# Patient Record
Sex: Male | Born: 1980
Health system: Southern US, Community
[De-identification: ages and names within clinical notes are randomized; demographics above are authoritative.]

## PROBLEM LIST (undated history)

## (undated) DIAGNOSIS — N189 Chronic kidney disease, unspecified: Secondary | ICD-10-CM

## (undated) DIAGNOSIS — G629 Polyneuropathy, unspecified: Secondary | ICD-10-CM

## (undated) DIAGNOSIS — M7989 Other specified soft tissue disorders: Secondary | ICD-10-CM

## (undated) DIAGNOSIS — E785 Hyperlipidemia, unspecified: Secondary | ICD-10-CM

## (undated) DIAGNOSIS — H3500 Unspecified background retinopathy: Secondary | ICD-10-CM

## (undated) DIAGNOSIS — D649 Anemia, unspecified: Secondary | ICD-10-CM

## (undated) HISTORY — DX: Polyneuropathy, unspecified: G62.9

## (undated) HISTORY — PX: EYE SURGERY: SHX253

## (undated) HISTORY — PX: ARTERIOVENOUS GRAFT PLACEMENT: SUR1029

## (undated) HISTORY — DX: Other specified soft tissue disorders: M79.89

## (undated) HISTORY — DX: Unspecified background retinopathy: H35.00

## (undated) HISTORY — DX: Hyperlipidemia, unspecified: E78.5

## (undated) HISTORY — DX: Chronic kidney disease, unspecified: N18.9

## (undated) HISTORY — DX: Anemia, unspecified: D64.9

---

## 2005-03-17 ENCOUNTER — Emergency Department (HOSPITAL_COMMUNITY): Admission: EM | Admit: 2005-03-17 | Discharge: 2005-03-17 | Payer: Self-pay | Admitting: Emergency Medicine

## 2006-08-26 ENCOUNTER — Ambulatory Visit (HOSPITAL_COMMUNITY): Admission: RE | Admit: 2006-08-26 | Discharge: 2006-08-26 | Payer: Self-pay | Admitting: Unknown Physician Specialty

## 2007-01-14 ENCOUNTER — Ambulatory Visit: Payer: Self-pay | Admitting: Endocrinology

## 2007-01-14 ENCOUNTER — Encounter: Payer: Self-pay | Admitting: Endocrinology

## 2007-01-14 DIAGNOSIS — E109 Type 1 diabetes mellitus without complications: Secondary | ICD-10-CM | POA: Insufficient documentation

## 2007-01-14 DIAGNOSIS — I10 Essential (primary) hypertension: Secondary | ICD-10-CM | POA: Insufficient documentation

## 2007-01-14 LAB — CONVERTED CEMR LAB
BUN: 38 mg/dL — ABNORMAL HIGH (ref 6–23)
CO2: 33 meq/L — ABNORMAL HIGH (ref 19–32)
Calcium: 9.7 mg/dL (ref 8.4–10.5)
Chloride: 110 meq/L (ref 96–112)
Cholesterol: 209 mg/dL (ref 0–200)
Creatinine, Ser: 1.6 mg/dL — ABNORMAL HIGH (ref 0.4–1.5)
Direct LDL: 143.5 mg/dL
GFR calc Af Amer: 68 mL/min
GFR calc non Af Amer: 56 mL/min
Glucose, Bld: 134 mg/dL — ABNORMAL HIGH (ref 70–99)
HDL: 41.1 mg/dL (ref >39.0)
Hgb A1c MFr Bld: 10.4 % — ABNORMAL HIGH (ref 4.6–6.0)
Potassium: 5.6 meq/L — ABNORMAL HIGH (ref 3.5–5.1)
Sodium: 147 meq/L — ABNORMAL HIGH (ref 135–145)
TSH: 2.1 microintl units/mL (ref 0.35–5.50)
Total CHOL/HDL Ratio: 5.1
Triglycerides: 114 mg/dL (ref 0–149)
VLDL: 23 mg/dL (ref 0–40)

## 2007-01-19 ENCOUNTER — Ambulatory Visit: Payer: Self-pay

## 2007-01-19 ENCOUNTER — Encounter: Payer: Self-pay | Admitting: Endocrinology

## 2007-02-03 ENCOUNTER — Encounter: Payer: Self-pay | Admitting: *Deleted

## 2007-02-04 ENCOUNTER — Ambulatory Visit: Payer: Self-pay | Admitting: Endocrinology

## 2007-04-22 ENCOUNTER — Ambulatory Visit: Payer: Self-pay | Admitting: Endocrinology

## 2007-04-22 DIAGNOSIS — E875 Hyperkalemia: Secondary | ICD-10-CM

## 2007-04-22 DIAGNOSIS — E7800 Pure hypercholesterolemia, unspecified: Secondary | ICD-10-CM

## 2007-04-24 LAB — CONVERTED CEMR LAB
ALT: 29 units/L (ref 0–53)
AST: 23 units/L (ref 0–37)
Albumin: 3.2 g/dL — ABNORMAL LOW (ref 3.5–5.2)
Alkaline Phosphatase: 68 units/L (ref 39–117)
BUN: 39 mg/dL — ABNORMAL HIGH (ref 6–23)
Bilirubin, Direct: 0.2 mg/dL (ref 0.0–0.3)
CO2: 33 meq/L — ABNORMAL HIGH (ref 19–32)
Calcium: 8.9 mg/dL (ref 8.4–10.5)
Chloride: 100 meq/L (ref 96–112)
Cholesterol: 174 mg/dL (ref 0–200)
Creatinine, Ser: 1.7 mg/dL — ABNORMAL HIGH (ref 0.4–1.5)
GFR calc Af Amer: 62 mL/min
GFR calc non Af Amer: 52 mL/min
Glucose, Bld: 320 mg/dL — ABNORMAL HIGH (ref 70–99)
HDL: 34.7 mg/dL — ABNORMAL LOW (ref >39.0)
LDL Cholesterol: 124 mg/dL — ABNORMAL HIGH (ref 0–99)
Sodium: 137 meq/L (ref 135–145)
Total Bilirubin: 1 mg/dL (ref 0.3–1.2)
Triglycerides: 76 mg/dL (ref 0–149)
VLDL: 15 mg/dL (ref 0–40)

## 2007-05-17 ENCOUNTER — Encounter: Payer: Self-pay | Admitting: Endocrinology

## 2007-07-19 ENCOUNTER — Encounter: Payer: Self-pay | Admitting: Endocrinology

## 2007-09-22 ENCOUNTER — Ambulatory Visit: Payer: Self-pay | Admitting: Endocrinology

## 2007-09-22 LAB — CONVERTED CEMR LAB: Hgb A1c MFr Bld: 10.1 % — ABNORMAL HIGH (ref 4.6–6.0)

## 2008-02-13 ENCOUNTER — Telehealth: Payer: Self-pay | Admitting: Endocrinology

## 2008-02-27 ENCOUNTER — Telehealth: Payer: Self-pay | Admitting: Endocrinology

## 2008-03-07 ENCOUNTER — Encounter: Payer: Self-pay | Admitting: Endocrinology

## 2008-03-08 ENCOUNTER — Ambulatory Visit: Payer: Self-pay | Admitting: Endocrinology

## 2008-03-08 ENCOUNTER — Emergency Department (HOSPITAL_COMMUNITY): Admission: EM | Admit: 2008-03-08 | Discharge: 2008-03-08 | Payer: Self-pay | Admitting: *Deleted

## 2008-03-08 ENCOUNTER — Telehealth: Payer: Self-pay | Admitting: Endocrinology

## 2008-03-09 ENCOUNTER — Telehealth: Payer: Self-pay | Admitting: Endocrinology

## 2008-03-09 ENCOUNTER — Telehealth (INDEPENDENT_AMBULATORY_CARE_PROVIDER_SITE_OTHER): Payer: Self-pay | Admitting: *Deleted

## 2008-03-10 ENCOUNTER — Telehealth (INDEPENDENT_AMBULATORY_CARE_PROVIDER_SITE_OTHER): Payer: Self-pay | Admitting: *Deleted

## 2008-03-14 ENCOUNTER — Telehealth (INDEPENDENT_AMBULATORY_CARE_PROVIDER_SITE_OTHER): Payer: Self-pay | Admitting: *Deleted

## 2008-03-14 ENCOUNTER — Ambulatory Visit: Payer: Self-pay | Admitting: Endocrinology

## 2008-03-15 ENCOUNTER — Ambulatory Visit (HOSPITAL_COMMUNITY): Admission: RE | Admit: 2008-03-15 | Discharge: 2008-03-16 | Payer: Self-pay | Admitting: Ophthalmology

## 2008-03-19 ENCOUNTER — Telehealth: Payer: Self-pay | Admitting: Endocrinology

## 2008-04-11 ENCOUNTER — Telehealth: Payer: Self-pay | Admitting: Endocrinology

## 2008-06-07 ENCOUNTER — Ambulatory Visit: Payer: Self-pay | Admitting: Endocrinology

## 2008-08-23 ENCOUNTER — Other Ambulatory Visit: Payer: Self-pay | Admitting: Ophthalmology

## 2008-08-23 ENCOUNTER — Inpatient Hospital Stay (HOSPITAL_COMMUNITY): Admission: EM | Admit: 2008-08-23 | Discharge: 2008-08-26 | Payer: Self-pay | Admitting: Emergency Medicine

## 2008-08-23 ENCOUNTER — Ambulatory Visit: Payer: Self-pay | Admitting: Cardiology

## 2008-08-24 ENCOUNTER — Encounter (INDEPENDENT_AMBULATORY_CARE_PROVIDER_SITE_OTHER): Payer: Self-pay | Admitting: Internal Medicine

## 2009-06-25 ENCOUNTER — Telehealth (INDEPENDENT_AMBULATORY_CARE_PROVIDER_SITE_OTHER): Payer: Self-pay | Admitting: *Deleted

## 2009-06-25 ENCOUNTER — Ambulatory Visit (HOSPITAL_COMMUNITY): Admission: RE | Admit: 2009-06-25 | Discharge: 2009-06-26 | Payer: Self-pay | Admitting: Ophthalmology

## 2009-10-07 ENCOUNTER — Ambulatory Visit (HOSPITAL_COMMUNITY): Admission: RE | Admit: 2009-10-07 | Discharge: 2009-10-07 | Payer: Self-pay | Admitting: Chiropractic Medicine

## 2010-06-03 NOTE — Progress Notes (Signed)
   Phone Note From Other Clinic   Caller: [facility] Details for Reason: pt.information Initial call taken by: Joice Lofts Echo over to [facility] @ fax [phone] [facility]  June 25, 2009 10:39 AM

## 2010-07-23 LAB — CBC
HCT: 40.6 % (ref 39.0–52.0)
Hemoglobin: 13.6 g/dL (ref 13.0–17.0)
MCHC: 33.6 g/dL (ref 30.0–36.0)
MCV: 80.6 fL (ref 78.0–100.0)
Platelets: 312 10*3/uL (ref 150–400)
RBC: 5.03 MIL/uL (ref 4.22–5.81)
RDW: 13.8 % (ref 11.5–15.5)
WBC: 8.9 10*3/uL (ref 4.0–10.5)

## 2010-07-23 LAB — GLUCOSE, RANDOM: Glucose, Bld: 517 mg/dL — ABNORMAL HIGH (ref 70–99)

## 2010-07-23 LAB — GLUCOSE, CAPILLARY
Glucose-Capillary: 125 mg/dL — ABNORMAL HIGH (ref 70–99)
Glucose-Capillary: 156 mg/dL — ABNORMAL HIGH (ref 70–99)
Glucose-Capillary: 182 mg/dL — ABNORMAL HIGH (ref 70–99)
Glucose-Capillary: 228 mg/dL — ABNORMAL HIGH (ref 70–99)
Glucose-Capillary: 287 mg/dL — ABNORMAL HIGH (ref 70–99)
Glucose-Capillary: 392 mg/dL — ABNORMAL HIGH (ref 70–99)
Glucose-Capillary: 414 mg/dL — ABNORMAL HIGH (ref 70–99)
Glucose-Capillary: 432 mg/dL — ABNORMAL HIGH (ref 70–99)

## 2010-08-13 LAB — BASIC METABOLIC PANEL
BUN: 17 mg/dL (ref 6–23)
BUN: 30 mg/dL — ABNORMAL HIGH (ref 6–23)
CO2: 23 mEq/L (ref 19–32)
CO2: 29 mEq/L (ref 19–32)
CO2: 29 mEq/L (ref 19–32)
Calcium: 8.4 mg/dL (ref 8.4–10.5)
Calcium: 8.4 mg/dL (ref 8.4–10.5)
Calcium: 8.5 mg/dL (ref 8.4–10.5)
Calcium: 9.3 mg/dL (ref 8.4–10.5)
Chloride: 100 mEq/L (ref 96–112)
Chloride: 101 mEq/L (ref 96–112)
Creatinine, Ser: 1.6 mg/dL — ABNORMAL HIGH (ref 0.4–1.5)
Creatinine, Ser: 1.75 mg/dL — ABNORMAL HIGH (ref 0.4–1.5)
Creatinine, Ser: 1.76 mg/dL — ABNORMAL HIGH (ref 0.4–1.5)
GFR calc Af Amer: 56 mL/min — ABNORMAL LOW (ref >60)
GFR calc Af Amer: 56 mL/min — ABNORMAL LOW (ref >60)
GFR calc Af Amer: >60 mL/min (ref >60)
GFR calc non Af Amer: 47 mL/min — ABNORMAL LOW (ref >60)
GFR calc non Af Amer: 52 mL/min — ABNORMAL LOW (ref >60)
GFR calc non Af Amer: >60 mL/min (ref >60)
Glucose, Bld: 214 mg/dL — ABNORMAL HIGH (ref 70–99)
Glucose, Bld: 235 mg/dL — ABNORMAL HIGH (ref 70–99)
Glucose, Bld: 423 mg/dL — ABNORMAL HIGH (ref 70–99)
Glucose, Bld: 457 mg/dL — ABNORMAL HIGH (ref 70–99)
Potassium: 3.7 mEq/L (ref 3.5–5.1)
Potassium: 5.6 mEq/L — ABNORMAL HIGH (ref 3.5–5.1)
Sodium: 135 mEq/L (ref 135–145)
Sodium: 136 mEq/L (ref 135–145)

## 2010-08-13 LAB — URINALYSIS, ROUTINE W REFLEX MICROSCOPIC
Bilirubin Urine: NEGATIVE
Glucose, UA: >1000 mg/dL — AB
Ketones, ur: 15 mg/dL — AB
Leukocytes, UA: NEGATIVE
Nitrite: NEGATIVE
Protein, ur: 100 mg/dL — AB
Specific Gravity, Urine: 1.025 (ref 1.005–1.030)
Urobilinogen, UA: 0.2 mg/dL (ref 0.0–1.0)
pH: 5.5 (ref 5.0–8.0)

## 2010-08-13 LAB — CBC
HCT: 44.5 % (ref 39.0–52.0)
HCT: 45.6 % (ref 39.0–52.0)
Hemoglobin: 15.1 g/dL (ref 13.0–17.0)
Hemoglobin: 15.2 g/dL (ref 13.0–17.0)
MCHC: 33.3 g/dL (ref 30.0–36.0)
MCHC: 33.3 g/dL (ref 30.0–36.0)
MCHC: 33.9 g/dL (ref 30.0–36.0)
MCV: 80.1 fL (ref 78.0–100.0)
MCV: 80.6 fL (ref 78.0–100.0)
MCV: 81.9 fL (ref 78.0–100.0)
Platelets: 229 10*3/uL (ref 150–400)
Platelets: 244 10*3/uL (ref 150–400)
Platelets: 252 10*3/uL (ref 150–400)
RBC: 4.97 MIL/uL (ref 4.22–5.81)
RBC: 5.55 MIL/uL (ref 4.22–5.81)
RBC: 5.66 MIL/uL (ref 4.22–5.81)
RDW: 13.3 % (ref 11.5–15.5)
RDW: 13.3 % (ref 11.5–15.5)
RDW: 13.9 % (ref 11.5–15.5)
WBC: 7.6 10*3/uL (ref 4.0–10.5)
WBC: 8.1 10*3/uL (ref 4.0–10.5)

## 2010-08-13 LAB — DIFFERENTIAL
Basophils Absolute: 0 K/uL (ref 0.0–0.1)
Basophils Relative: 0 % (ref 0–1)
Eosinophils Absolute: 0.1 K/uL (ref 0.0–0.7)
Eosinophils Relative: 2 % (ref 0–5)
Lymphocytes Relative: 16 % (ref 12–46)
Lymphs Abs: 1.2 K/uL (ref 0.7–4.0)
Monocytes Absolute: 0.4 K/uL (ref 0.1–1.0)
Monocytes Relative: 5 % (ref 3–12)
Neutro Abs: 5.8 K/uL (ref 1.7–7.7)
Neutrophils Relative %: 76 % (ref 43–77)

## 2010-08-13 LAB — COMPREHENSIVE METABOLIC PANEL
ALT: 31 U/L (ref 0–53)
AST: 22 U/L (ref 0–37)
Albumin: 3 g/dL — ABNORMAL LOW (ref 3.5–5.2)
Alkaline Phosphatase: 81 U/L (ref 39–117)
BUN: 30 mg/dL — ABNORMAL HIGH (ref 6–23)
CO2: 25 mEq/L (ref 19–32)
Calcium: 9 mg/dL (ref 8.4–10.5)
Chloride: 100 mEq/L (ref 96–112)
Creatinine, Ser: 1.87 mg/dL — ABNORMAL HIGH (ref 0.4–1.5)
GFR calc Af Amer: 52 mL/min — ABNORMAL LOW (ref >60)
GFR calc non Af Amer: 43 mL/min — ABNORMAL LOW (ref >60)
Glucose, Bld: 422 mg/dL — ABNORMAL HIGH (ref 70–99)
Potassium: 5.9 mEq/L — ABNORMAL HIGH (ref 3.5–5.1)
Sodium: 132 mEq/L — ABNORMAL LOW (ref 135–145)
Total Bilirubin: 1.3 mg/dL — ABNORMAL HIGH (ref 0.3–1.2)
Total Protein: 6.1 g/dL (ref 6.0–8.3)

## 2010-08-13 LAB — SODIUM, URINE, RANDOM: Sodium, Ur: 56 mEq/L

## 2010-08-13 LAB — POCT I-STAT, CHEM 8
BUN: 34 mg/dL — ABNORMAL HIGH (ref 6–23)
Calcium, Ion: 1.17 mmol/L (ref 1.12–1.32)
Chloride: 103 mEq/L (ref 96–112)
Creatinine, Ser: 1.7 mg/dL — ABNORMAL HIGH (ref 0.4–1.5)
TCO2: 24 mmol/L (ref 0–100)

## 2010-08-13 LAB — BASIC METABOLIC PANEL WITH GFR
BUN: 25 mg/dL — ABNORMAL HIGH (ref 6–23)
BUN: 25 mg/dL — ABNORMAL HIGH (ref 6–23)
CO2: 26 meq/L (ref 19–32)
CO2: 27 meq/L (ref 19–32)
Calcium: 8.4 mg/dL (ref 8.4–10.5)
Calcium: 8.6 mg/dL (ref 8.4–10.5)
Chloride: 100 meq/L (ref 96–112)
Chloride: 99 meq/L (ref 96–112)
Creatinine, Ser: 1.44 mg/dL (ref 0.4–1.5)
Creatinine, Ser: 1.81 mg/dL — ABNORMAL HIGH (ref 0.4–1.5)
GFR calc non Af Amer: 45 mL/min — ABNORMAL LOW
GFR calc non Af Amer: 58 mL/min — ABNORMAL LOW
Glucose, Bld: 293 mg/dL — ABNORMAL HIGH (ref 70–99)
Glucose, Bld: 486 mg/dL — ABNORMAL HIGH (ref 70–99)
Potassium: 4.4 meq/L (ref 3.5–5.1)
Potassium: 4.4 meq/L (ref 3.5–5.1)
Sodium: 133 meq/L — ABNORMAL LOW (ref 135–145)
Sodium: 137 meq/L (ref 135–145)

## 2010-08-13 LAB — RAPID URINE DRUG SCREEN, HOSP PERFORMED
Amphetamines: NOT DETECTED
Barbiturates: NOT DETECTED
Benzodiazepines: NOT DETECTED
Cocaine: NOT DETECTED
Opiates: NOT DETECTED
Tetrahydrocannabinol: NOT DETECTED

## 2010-08-13 LAB — CARDIAC PANEL(CRET KIN+CKTOT+MB+TROPI)
CK, MB: 11.6 ng/mL — ABNORMAL HIGH (ref 0.3–4.0)
CK, MB: 12.7 ng/mL — ABNORMAL HIGH (ref 0.3–4.0)
Relative Index: 3.4 — ABNORMAL HIGH (ref 0.0–2.5)
Relative Index: 4.1 — ABNORMAL HIGH (ref 0.0–2.5)
Total CK: 310 U/L — ABNORMAL HIGH (ref 7–232)
Total CK: 345 U/L — ABNORMAL HIGH (ref 7–232)

## 2010-08-13 LAB — GLUCOSE, CAPILLARY
Glucose-Capillary: 206 mg/dL — ABNORMAL HIGH (ref 70–99)
Glucose-Capillary: 241 mg/dL — ABNORMAL HIGH (ref 70–99)
Glucose-Capillary: 247 mg/dL — ABNORMAL HIGH (ref 70–99)
Glucose-Capillary: 253 mg/dL — ABNORMAL HIGH (ref 70–99)
Glucose-Capillary: 262 mg/dL — ABNORMAL HIGH (ref 70–99)
Glucose-Capillary: 384 mg/dL — ABNORMAL HIGH (ref 70–99)
Glucose-Capillary: 401 mg/dL — ABNORMAL HIGH (ref 70–99)
Glucose-Capillary: 433 mg/dL — ABNORMAL HIGH (ref 70–99)
Glucose-Capillary: 439 mg/dL — ABNORMAL HIGH (ref 70–99)

## 2010-08-13 LAB — URINE MICROSCOPIC-ADD ON

## 2010-08-13 LAB — HEMOGLOBIN A1C: Mean Plasma Glucose: 240 mg/dL

## 2010-08-13 LAB — POCT CARDIAC MARKERS
CKMB, poc: 8.4 ng/mL (ref 1.0–8.0)
Myoglobin, poc: 201 ng/mL (ref 12–200)
Troponin i, poc: <0.05 ng/mL (ref 0.00–0.09)
Troponin i, poc: <0.05 ng/mL (ref 0.00–0.09)

## 2010-08-13 LAB — CATECHOLAMINES, FRACTIONATED, URINE, 24 HOUR

## 2010-08-13 LAB — FRACTIONAL EXCRET-NA(24HR UR +SERUM)
Creatinine, Ser: 1.59 mg/dL — ABNORMAL HIGH (ref 0.40–1.50)
Sodium, Ur: 54 mEq/L

## 2010-08-13 LAB — BRAIN NATRIURETIC PEPTIDE: Pro B Natriuretic peptide (BNP): 77 pg/mL (ref 0.0–100.0)

## 2010-08-13 LAB — TSH: TSH: 1.186 u[IU]/mL (ref 0.350–4.500)

## 2010-09-16 NOTE — Discharge Summary (Signed)
Derek Johnston, Derek Johnston                ACCOUNT NO.:  [id number]   MEDICAL RECORD NO.:  [id number]          PATIENT TYPE:  INP   LOCATION:  4703                         FACILITY:  [facility]   PHYSICIAN:  Charlestine Massed, MDDATE OF BIRTH:  1981/03/30   DATE OF ADMISSION:  08/23/2008  DATE OF DISCHARGE:  08/26/2008                               DISCHARGE SUMMARY   PRIMARY CARE PHYSICIAN:  Margaretmary Bayley, M.D.   REASON FOR ADMISSION:  High blood pressure and hyperglycemia.  He was  seen preop for a left eye vitrectomy by Dr. Beulah Gandy. Derek Johnston.   DISCHARGE DIAGNOSIS:  1. Hypertensive emergency:  Currently the blood pressure is      controlled.  2. Diabetes mellitus, possibly type 2.  3. History of being planned for left eye vitrectomy by Dr. Ashley Johnston.  4. Acute renal failure, secondary to hypertension.   MEDICATIONS:  1. Lantus 32 units q.h.s. subcutaneously at night.  2. NovoLog coverage q.a.c. and q.h.s. subcutaneously at the [facility] moderate scale of insulin sliding scale.  3. Labetalol 300 mg p.o. b.i.d.  4. Procardia XL 90 mg p.o. daily.   HOSPITAL COURSE:  1. Hypertensive emergency:  The patient was seen at the [facility] for a left eye vitrectomy electively by Dr. Ashley Johnston, when      his blood pressure was found to be 200/130, and his blood sugar was      420, so the procedure was cancelled and he was sent to the      emergency room.  The patient was seen in the emergency room.  He      did not have any shortness of breath, no chest pain, no headache,      no dizziness.  Blood pressure was initially controlled with IV      labetalol.  The patient was admitted to the ICU and he denied any      symptoms of episodic palpitations, blushing, headache or dizziness,      to suggest any him pheochromocytoma.  The patient was admitted to      the ICU and then levels for serum aldosterone, renin and      angiotensin were sent.  They are still pending.  A CAT  scan      examination and MRI were done, which did not reveal any mass on the      kidneys.  An MRA of the renal artery was done for renal artery      stenosis, which is negative, so the patient was started on p.o.      medications.  His blood has been very well controlled and so he is      ready for discharge.   1. Acute renal failure:  When he came in, his creatinine was 1.81 and      his fractional excretion of sodium was found to be 1, so      possibility of acute renal failure, secondary to renal artery      stenosis was thought.  The MRA proved  to be negative for renal      artery stenosis.  His creatinine improved considerably.  The latest      creatinine is 1.37 with a GFR of more than 60, so currently his      acute renal failure has resolved.  There is no evidence of any      hyperkalemia which was present at the time of admission.  His      electrolytes are stable at this moment, so he is being discharged.      He has been advised to see Dr. Margaretmary Bayley in two to three days,      and have a BMP checked.  The latest BMP reveals a BUN of 13 and      creatinine of 1.37.   1. Diabetes mellitus:  He did not have diabetes from birth.  He was on      insulin Lantus at home, which initially he was on an insulin drip.      The high sugar could be due to the acute stress, and currently his      blood sugars are controlled, still running in the low 200's, so      increased the dose of Lantus  to 32 units and continue NovoLog      coverage at a moderate scale.  The patient has Glucometer at home      and he will continue to check, and he is aware that it is hard to      follow up diabetes. He will report to the emergency room or to his      primary doctor when blood sugars are very high at more than 250.   1. Other risk factors:  The patient has hypertension and diabetes, so      he has a risk for coronary artery disease.  In the acute stressed      state, I do not wish to do a lipid  profile, which could not reflect      the exact values, so I would recommend the primary care doctor do a      fasting lipid profile while in the office after his acute stress      issues were resolved after discharge, and then to follow up on      that.  Currently the patient has been advised to have either a      heart-healthy diet with a diabetic component of an 1800 kilocalorie      ADA diet.   DISPOSITION:  The patient is discharged back home.   DISCHARGE FOLLOWUP:  To follow up with Dr. Margaretmary Bayley in two to three  days.   DISCHARGE INSTRUCTIONS:  1. Follow a diet as prescribed, a low-sodium, heart-healthy and      diabetic diet.  2. Primary MD to be check the BMP at the office in two to three days.   LABORATORY DATA:  The last basic metabolic profile done on August 26, 2008, shows a sodium 136, potassium 4.0, chloride 103, bicarb 29, BUN  13, creatinine 1.37, glucose 235.   Tests done in the hospital during the stay are:  An MRA of the abdomen  for rule out renal artery stenosis has been shown to have normal  bilateral renal arteries, widely patent, and there is no obstruction  seen in the iliac, as well as Nissen in celiac triangle and negative for  renal artery stenosis.  A chest x-ray done on April 29,  20, 2010, no  evidence of any cardiomegaly, no active disease.  No pulmonary  congestion.   TESTS PENDING:  The serum catecholamines and a  24-hour urinary  catecholamines, serum aldosterone, serum renin.   FOLLOWUP:  The PMD is requested to follow up these labs from [facility] which may be available by the coming Wednesday.   A total of 45 minutes spent on the discharge.      Charlestine Massed, MD  Electronically Signed     UT/MEDQ  D:  08/26/2008  T:  08/26/2008  Job:  [id number]   cc:   Margaretmary Bayley, M.D.

## 2010-09-16 NOTE — Op Note (Signed)
Derek Johnston, Derek Johnston                ACCOUNT NO.:  [id number]   MEDICAL RECORD NO.:  [id number]          PATIENT TYPE:  OIB   LOCATION:  5120                         FACILITY:  [facility]   PHYSICIAN:  Beulah Gandy. [name], M.D. 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   PROCEDURES:  Pars plana vitrectomy with repair of complex retinal  detachment, extensive membrane peeling, retinal photocoagulation, gas-  fluid exchange, Endo diathermy in the right eye.   SURGEON:  Beulah Gandy. [name], MD   ASSISTANT:  Rosalie Doctor, MA   ANESTHESIA:  General.   DETAILS:  Usual prep and drape, 25-gauge trocars placed at 8, 10, and 2  o'clock, infusion at 8 o'clock.  Provisc placed on the corneal surface.  Pars plana vitrectomy was begun just behind the crystalline lens.  Extensive bands of anterior-posterior traction were encountered.  These  were occurred for 360 degrees.  Extensive band extended from the disk to  the lower temporal and lower nasal arcades out to the ora serrata.  These bands were circumcised, incised with the 25-gauge cutter.  Diathermy eyes with Endo diathermy and peeled with 25-gauge forceps.  The vitreous traction on the disk was peeled with 25-gauge forceps.  The  additional traction bands were encountered above in a similar fashion.  These were attacked by circumcising them and then incising them along  the retinal surface.  The lighted pick and the forceps were used to  grasp the membrane and peeled them from their attachments to the disc  and upper arcades.  The bands were removed with the vitreous cutter and  the vitreous forceps.  Once all bands were removed or released, the  silicone tip suction line was  brought in the eye and drawn down to the  macular surface where blood was vacuumed from the surface of the retina.  Bleeding points were treated with diathermy and wet-field cautery.  The  endolaser was positioned in the eye.  1414 burns were placed around the  periphery with a power of 2000 milliwatts, 1000 microns each, and 0.1  second each.  A total gas-fluid exchange was performed.  Perfluoropropane was prepared in 12% concentration.  Additional fluid  and blood was vacuumed from the surface of the retina and the macula  with silicone tip suction line.  Then, gas-fluid exchange was performed.  The perfluoropropane gas exchange was performed as well.  The  instruments were removed from the eye.  Additional gas was added to  reinflate the globe to a pressure of 10 with a Barraquer tonometer.  Polymyxin and gentamicin were irrigated into [place] space.  Atropine  solution was applied.  Marcaine was injected around the globe for postop  pain.  Decadron 10 mg was injected to the lower subconjunctival space.  Polysporin ophthalmic ointment and patch and shield were placed.  The  patient was awakened  and taken to recovery in satisfactory condition.   COMPLICATIONS:  None.   DURATION:  1 hour and 15 minutes.      Beulah Gandy. [name], M.D.  Electronically Signed     JDM/MEDQ  D:  03/15/2008  T:  03/16/2008  Job:  [id number]

## 2010-09-16 NOTE — H&P (Signed)
Derek Johnston, Derek Johnston                ACCOUNT NO.:  [id number]   MEDICAL RECORD NO.:  [id number]          PATIENT TYPE:  INP   LOCATION:  1825                         FACILITY:  [facility]   PHYSICIAN:  Peggye Pitt, M.D. DATE OF BIRTH:  [birth year]   DATE OF ADMISSION:  08/23/2008  DATE OF DISCHARGE:                              HISTORY & PHYSICAL   CHIEF COMPLAINT:  Hypertension and hyperglycemia.   HISTORY OF PRESENT ILLNESS:  Derek Johnston is a pleasant 30 year old African-  American man who was at short stay today for a left eye vitrectomy by  Dr. [name] and was sent to the ED secondary to a severely elevated  blood pressure to 200/130 and an elevated blood sugar to 420.  His  procedure was cancelled, and he was sent to the emergency department for  further evaluation and management.  Upon examination of the patient, he  has absolutely no symptoms.  Denies any dizziness, lightheadedness,  headaches, chest pain, shortness of breath or anything else.   ALLERGIES:  He has no known drug allergies.   PAST MEDICAL HISTORY:  1. Hypertension.  2. Type 1 diabetes mellitus.   HOME MEDICATIONS:  1. Lantus 20 units subcutaneously at bedtime.  2. NovoLog sliding scale insulin.  3. Quinapril 20 mg daily.   SOCIAL HISTORY:  He lives with his mother, is not married, has no  children.  Works at [employer].  Denies any alcohol,  tobacco or illicit drug use.   FAMILY HISTORY:  Only significant for hypertension in his father.  Otherwise, no diabetes or heart problems in his family.   REVIEW OF SYSTEMS:  Completely negative.   PHYSICAL EXAMINATION:  VITAL SIGNS:  Upon admission, blood pressure  195/129, heart rate 109, respirations 18, O2 saturations 97% on room air  with a temperature of 97.6.  GENERAL:  He is alert, awake, oriented x3, in no distress.  HEENT:  Normocephalic, atraumatic.  Pupils are equally reactive to light  and accommodation with intact extraocular  movements.  NECK:  Supple.  No JVD, no lymphadenopathy, no bruits, no goiter.  HEART:  Regular rate and rhythm with no murmurs, rubs or gallops.  LUNGS:  Clear to auscultation bilaterally.  ABDOMEN:  Soft, nontender, nondistended with positive bowel sounds.  EXTREMITIES:  No edema, positive pulses.  NEUROLOGIC:  Completely intact and nonfocal.   LABORATORIES UPON ADMISSION:  Sodium 132, potassium 5.9, chloride 100,  bicarbonate 25, BUN 30, creatinine 1.87 with a glucose of 422, a total  bilirubin of 1.3 and an albumin of 3.0.  WBC 7.6, hemoglobin 15.2 with  platelet count of 244.  Two sets of point of care markers are negative.  Urinalysis has greater than 1000 of glucose.  EKG has a normal sinus  rhythm with left ventricle hypertrophy.   ASSESSMENT AND PLAN:  1. For his hypertensive urgency, will admit to step-down unit, start      him on an IV labetalol drip.  Will check a urinary drug screen.      Given his young age, will also screen for secondary causes of  hypertension.  Will check an MRI, MRA of his abdomen to rule out      renal artery stenosis, especially since he has been newly started      on an ACE inhibitor and he is experiencing some renal failure and      hyperkalemia.  Will also check a plasma and urine catecholamines to      rule out a pheochromocytoma, although this is less likely, given he      has not had episodic palpitations and headache and dizziness.  I      will also check a renin and aldosterone level to screen for primary      hyperaldosteronism.  Will also hold his ACE inhibitor at this time.  2. For his type 1 diabetes, his CBG is now down to 241.  Will cancel      order for a Glucommander and will start him on his half home dose      of Lantus and sliding scale insulin.  3. For his acute renal insufficiency, will check a urine sodium and a      urinary creatinine.  I hope most of this is acute.  Will start him      on a good dose of IV fluids normal  saline at 125 cc an hour.  If he      has no improvement in his creatinine, will need to pursue more      aggressive workup of his acute renal failure including a renal      ultrasound to rule out hydronephrosis.  4. For his hyperkalemia, in the ED he has already been given calcium      gluconate, Kayexalate and IV bicarbonate.  Will recheck his      potassium at 4:00 p.m. to follow.  5. For prophylaxis while in the hospital, he will be on Protonix for      GI prophylaxis and on Lovenox for DVT prophylaxis.      Peggye Pitt, M.D.  Electronically Signed     EH/MEDQ  D:  08/23/2008  T:  08/23/2008  Job:  [id number]   cc:   Margaretmary Bayley, M.D.

## 2010-12-10 ENCOUNTER — Encounter (INDEPENDENT_AMBULATORY_CARE_PROVIDER_SITE_OTHER): Payer: Self-pay | Admitting: Ophthalmology

## 2010-12-26 ENCOUNTER — Ambulatory Visit (INDEPENDENT_AMBULATORY_CARE_PROVIDER_SITE_OTHER): Payer: BC Managed Care – PPO | Admitting: Ophthalmology

## 2010-12-26 DIAGNOSIS — H2510 Age-related nuclear cataract, unspecified eye: Secondary | ICD-10-CM

## 2010-12-26 DIAGNOSIS — E11359 Type 2 diabetes mellitus with proliferative diabetic retinopathy without macular edema: Secondary | ICD-10-CM

## 2011-02-03 LAB — URINALYSIS, ROUTINE W REFLEX MICROSCOPIC
Bilirubin Urine: NEGATIVE
Glucose, UA: 500 — AB
Ketones, ur: NEGATIVE
Leukocytes, UA: NEGATIVE
Nitrite: NEGATIVE
Protein, ur: 100 — AB
Specific Gravity, Urine: 1.017
Urobilinogen, UA: 0.2
pH: 6.5

## 2011-02-03 LAB — POCT I-STAT, CHEM 8
BUN: 26 — ABNORMAL HIGH
Calcium, Ion: 1.14
Chloride: 102
Creatinine, Ser: 1.6 — ABNORMAL HIGH
Glucose, Bld: 201 — ABNORMAL HIGH
HCT: 50
Hemoglobin: 17
Potassium: 4.6
Sodium: 138
TCO2: 30

## 2011-02-03 LAB — BASIC METABOLIC PANEL
CO2: 27
Calcium: 9.4
Creatinine, Ser: 1.47
GFR calc non Af Amer: 57 — ABNORMAL LOW
Glucose, Bld: 204 — ABNORMAL HIGH

## 2011-02-03 LAB — URINE MICROSCOPIC-ADD ON

## 2011-02-03 LAB — GLUCOSE, CAPILLARY
Glucose-Capillary: 204 — ABNORMAL HIGH
Glucose-Capillary: 234 — ABNORMAL HIGH
Glucose-Capillary: 251 — ABNORMAL HIGH
Glucose-Capillary: 272 — ABNORMAL HIGH
Glucose-Capillary: 305 — ABNORMAL HIGH
Glucose-Capillary: 333 — ABNORMAL HIGH

## 2011-02-03 LAB — CBC
MCHC: 32.5
Platelets: 262
RDW: 13.7

## 2011-02-09 ENCOUNTER — Other Ambulatory Visit: Payer: Self-pay

## 2011-02-09 DIAGNOSIS — Z01810 Encounter for preprocedural cardiovascular examination: Secondary | ICD-10-CM

## 2011-02-09 DIAGNOSIS — N189 Chronic kidney disease, unspecified: Secondary | ICD-10-CM

## 2011-02-11 ENCOUNTER — Encounter: Payer: Self-pay | Admitting: Vascular Surgery

## 2011-02-18 ENCOUNTER — Encounter: Payer: Self-pay | Admitting: Vascular Surgery

## 2011-02-19 ENCOUNTER — Ambulatory Visit (INDEPENDENT_AMBULATORY_CARE_PROVIDER_SITE_OTHER): Payer: BC Managed Care – PPO | Admitting: Vascular Surgery

## 2011-02-19 ENCOUNTER — Encounter: Payer: Self-pay | Admitting: Vascular Surgery

## 2011-02-19 ENCOUNTER — Other Ambulatory Visit (INDEPENDENT_AMBULATORY_CARE_PROVIDER_SITE_OTHER): Payer: BC Managed Care – PPO | Admitting: *Deleted

## 2011-02-19 VITALS — BP 121/81 | HR 80 | Resp 16 | Ht 70.0 in | Wt 200.0 lb

## 2011-02-19 DIAGNOSIS — Z01810 Encounter for preprocedural cardiovascular examination: Secondary | ICD-10-CM

## 2011-02-19 DIAGNOSIS — N186 End stage renal disease: Secondary | ICD-10-CM

## 2011-02-19 DIAGNOSIS — N189 Chronic kidney disease, unspecified: Secondary | ICD-10-CM

## 2011-02-19 NOTE — Progress Notes (Signed)
[facility] [place] HISTORY AND PHYSICAL   History of Present Illness:  Patient is a 30 y.o. year old male who presents for placement of a permanent hemodialysis access. The patient is right handed .  The patient is not currently on hemodialysis.  The cause of renal failure is thought to be secondary to diabetes.  Other chronic medical problems include hyperlipidemia, anemia, peripheral neuropathy and retinopathy.  These are currently controlled.  Past Medical History  Diagnosis Date  . Diabetes mellitus   . Hyperlipidemia   . Chronic kidney disease   . Peripheral neuropathy   . Retinopathy   . Dyslipidemia   . Swelling of extremity   . Anemia     Past Surgical History  Procedure Date  . Eye surgery     Bilateral diabetic retina problems    ROS: [x]  Positive   [ ]  Negative   [x ] All sytems reviewed and are negative  General:[ ]  Weight loss, [ ]  Fever, [ ]  chills Neurologic: [ ]  Dizziness, [ ]  Blackouts, [ ]  Seizure [ ]  Stroke, [ ]  "Mini stroke", [ ]  Slurred speech, [ ]  Temporary blindness;  [ ] weakness,  [ ]  Hoarseness Cardiac: [ ]  Chest pain/pressure, [ ]  Shortness of breath at rest [ ]  Shortness of breath with exertion,  [ ]   Atrial fibrillation or irregular heartbeat Vascular:[ ]  Pain in legs with walking, [ ]  Pain in legs at rest ,[ ]  Pain in legs at night,  [ ]   Non-healing ulcer, [ ]  Blood clot in vein/DVT,   Pulmonary: [ ]  Home oxygen, [ ]   Productive cough, [ ]  Coughing up blood,  [ ]  Asthma,  [ ]  Wheezing Musculoskeletal:  [ ]  Arthritis, [ ]  Low back pain,  [ ]  Joint pain Hematologic:[ ]  Easy Bruising, [ ]  Anemia; [ ]  Hepatitis Gastrointestinal: [ ]  Blood in stool,  [ ]  Gastroesophageal Reflux, [ ]  Trouble swallowing Urinary: [ ]  chronic Kidney disease, [ ]  on HD - [ ]  MWF or [ ]  TTHS, [ ]  Burning with urination, [ ]  Frequent urination, [ ]  Difficulty urinating;  Skin: [ ]  Rashes, [ ]  Wounds Psychological: [ ]  Anxiety,  [ ]  Depression  Social  History History  Substance Use Topics  . Smoking status: Never Smoker   . Smokeless tobacco: Not on file  . Alcohol Use: No    Family History Family History  Problem Relation Age of Onset  . Kidney disease Maternal Aunt   . Kidney disease Maternal Grandmother     Allergies  No Known Allergies   Current Outpatient Prescriptions  Medication Sig Dispense Refill  . amLODipine-olmesartan (AZOR) 5-20 MG per tablet Take 1 tablet by mouth daily.        . furosemide (LASIX) 40 MG tablet Take 40 mg by mouth daily.        . insulin glargine (LANTUS) 100 UNIT/ML injection Inject 15 Units into the skin at bedtime.        . insulin lispro (HUMALOG) 100 UNIT/ML injection Inject 55 Units into the skin 3 (three) times daily before meals.        Marland Kitchen labetalol (NORMODYNE) 300 MG tablet Take 300 mg by mouth 2 (two) times daily.        . paricalcitol (ZEMPLAR) 1 MCG capsule Take 1 mcg by mouth every Monday, Wednesday, and Friday.        . rosuvastatin (CRESTOR) 10 MG tablet Take 10 mg by mouth daily.  Physical Examination  Filed Vitals:   02/19/11 1119  BP: 121/81  Pulse: 80  Resp: 16  Height: 5\' 10"  (1.778 m)  Weight: 200 lb (90.719 kg)    Body mass index is 28.70 kg/(m^2).  General:  Alert and oriented, no acute distress HEENT: Normal Neck: No bruit or JVD Pulmonary: Clear to auscultation bilaterally Cardiac: Regular Rate and Rhythm without murmur Gastrointestinal: Soft, non-tender, non-distended, no mass, no scars Skin: No rash Extremity Pulses:  2+ radial, brachial pulses bilaterally Musculoskeletal: No deformity or edema  Neurologic: Upper and lower extremity motor 5/5 and symmetric  DATA: Vein mapping ultrasound of both extremities was evaluated today images were reviewed and interpreted. Left side cephalic vein is approximately 40 mm in diameter the forearm and 40-60 mm in diameter in the upper arm right arm shows 30 mm in diameter in the forearm 50 mm in the upper  basilic vein was between 30 and 70 mm bilaterally   ASSESSMENT:  Patient needs long-term hemodialysis access. I believe the best option for him initially with be a left radiocephalic AV fistula. Risks benefits possible complications and procedure details were explained the patient today including but not limited to bleeding infection ischemic steal non-maturation of the fistula.  PLAN: left radiocephalic AV fistula on 02/25/2011  Fabienne Bruns, MD [facility] [place] Office: [phone] Pager: [phone]

## 2011-02-24 ENCOUNTER — Encounter (HOSPITAL_COMMUNITY)
Admission: RE | Admit: 2011-02-24 | Discharge: 2011-02-24 | Disposition: A | Discharge status: Home / Self Care | Payer: BC Managed Care – PPO | Source: Ambulatory Visit | Attending: Vascular Surgery | Admitting: Vascular Surgery

## 2011-02-24 ENCOUNTER — Other Ambulatory Visit: Payer: Self-pay | Admitting: Vascular Surgery

## 2011-02-24 DIAGNOSIS — Z01811 Encounter for preprocedural respiratory examination: Secondary | ICD-10-CM

## 2011-02-24 LAB — SURGICAL PCR SCREEN
MRSA, PCR: NEGATIVE
Staphylococcus aureus: NEGATIVE

## 2011-02-25 ENCOUNTER — Ambulatory Visit (HOSPITAL_COMMUNITY)
Admission: RE | Admit: 2011-02-25 | Discharge: 2011-02-25 | Disposition: A | Discharge status: Home / Self Care | Payer: BC Managed Care – PPO | Source: Ambulatory Visit | Attending: Vascular Surgery | Admitting: Vascular Surgery

## 2011-02-25 DIAGNOSIS — Z794 Long term (current) use of insulin: Secondary | ICD-10-CM | POA: Insufficient documentation

## 2011-02-25 DIAGNOSIS — N186 End stage renal disease: Secondary | ICD-10-CM | POA: Insufficient documentation

## 2011-02-25 DIAGNOSIS — Z01810 Encounter for preprocedural cardiovascular examination: Secondary | ICD-10-CM | POA: Insufficient documentation

## 2011-02-25 DIAGNOSIS — Z01818 Encounter for other preprocedural examination: Secondary | ICD-10-CM | POA: Insufficient documentation

## 2011-02-25 DIAGNOSIS — Z01812 Encounter for preprocedural laboratory examination: Secondary | ICD-10-CM | POA: Insufficient documentation

## 2011-02-25 DIAGNOSIS — I120 Hypertensive chronic kidney disease with stage 5 chronic kidney disease or end stage renal disease: Secondary | ICD-10-CM | POA: Insufficient documentation

## 2011-02-25 DIAGNOSIS — E119 Type 2 diabetes mellitus without complications: Secondary | ICD-10-CM | POA: Insufficient documentation

## 2011-02-25 DIAGNOSIS — H3320 Serous retinal detachment, unspecified eye: Secondary | ICD-10-CM | POA: Insufficient documentation

## 2011-02-25 LAB — GLUCOSE, CAPILLARY: Glucose-Capillary: 326 mg/dL — ABNORMAL HIGH (ref 70–99)

## 2011-02-26 LAB — POCT I-STAT 4, (NA,K, GLUC, HGB,HCT)
Glucose, Bld: 330 mg/dL — ABNORMAL HIGH (ref 70–99)
HCT: 37 % — ABNORMAL LOW (ref 39.0–52.0)
Hemoglobin: 12.6 g/dL — ABNORMAL LOW (ref 13.0–17.0)

## 2011-02-26 NOTE — Procedures (Unsigned)
CEPHALIC VEIN MAPPING  INDICATION:  Chronic kidney disease.  HISTORY:  EXAM: The right cephalic vein is compressible.  Diameter measurements range from 0.31 to 0.49 cm.  The right basilic vein is compressible.  Diameter measurements range from 0.31 to 0.56 cm.  The left cephalic vein is compressible.  Diameter measurements range from 0.37 to 0.64 cm.  The left basilic vein is compressible.  Diameter measurements range from 0.37 to 0.7 cm.  See attached worksheet for all measurements.  IMPRESSION:  Patent bilateral cephalic and basilic veins with diameter measurements as described above.  ___________________________________________ Janetta Hora. Fields, MD  CH/MEDQ  D:  02/19/2011  T:  02/19/2011  Job:  [id number]

## 2011-03-03 NOTE — Op Note (Signed)
NAMEDURAN, OHERN                ACCOUNT NO.:  [id number]  MEDICAL RECORD NO.:  [id number]  LOCATION:  SDSC                         FACILITY:  [facility]  PHYSICIAN:  Janetta Hora. Orla Estrin, MD  DATE OF BIRTH:  10-30-1980  DATE OF PROCEDURE:  02/25/2011 DATE OF DISCHARGE:  02/25/2011                              OPERATIVE REPORT   PROCEDURE:  Left radiocephalic AV fistula.  PREOPERATIVE DIAGNOSIS:  End-stage renal disease.  POSTOPERATIVE DIAGNOSIS:  End-stage renal disease.  ANESTHESIA:  Local with IV sedation.  ASSISTANT:  Pecola Leisure, PA-C.  FINDINGS:  A 2.5-mm left cephalic vein with calcified radial artery.  OPERATIVE DETAILS:  After obtaining informed consent, the patient was taken to the operating room.  The patient was placed in supine position on the operating room table.  After adequate sedation, the patient's entire left upper extremity was prepped and draped in usual sterile fashion.  Local anesthesia was infiltrated at the left wrist midway between the cephalic vein and radial artery.  An incision was made in this location and carried down through the subcutaneous tissues down the level of cephalic vein.  Preoperative vein map imaging had shown that the vein was greater than 3 mm in diameter.  However, it was quite small even though there was some spasm within the vein.  Several small side branches were ligated and divided between silk ties.  Next, the radial artery was dissected free in the medial portion incision.  There was calcification of the radial artery as well.  Calcification was worse distally.  There were islands of calcium more proximally.  The artery was dissected free circumferentially and vessel loops placed around this.  Small side branches were controlled with clips.  The patient was given 5000 units of intravenous heparin.  The distal cephalic vein was ligated with a 3-0 silk tie and the vein was transected and swung over the level of the artery.   The vein was gently distended with serial dilators and flushed thoroughly with heparinized saline.  It was then controlled proximally with fine bulldog clamp.  The artery was controlled proximally and distally with vessel loops.  A longitudinal opening was made in the radial artery and the vein was slightly spatulated and sewn end of vein to side of artery using a running 7-0 Prolene suture.  Just prior to completion of anastomosis, it was forebled, back bled, and thoroughly flushed.  Anastomosis was secured. Vessel loops were released.  Initially, the quality of the fistula was fairly pulsatile.  However, I took down several centimeters of subcutaneous tissue which were tethering the vein more proximally and the fistula flow seemed to improve somewhat.  The flow sounded reasonable with Doppler as well.  Next, hemostasis was obtained.  Subcutaneous tissues were reapproximated with running 3-0 Vicryl suture.  Skin was closed with 4-0 Vicryl subcuticular stitch.  The patient tolerated the procedure well.  There were no complications.  Instrument, sponge, and needle counts were correct at the end of the case.  The patient was taken to the recovery room in stable condition.     Janetta Hora. Kenasia Scheller, MD     CEF/MEDQ  D:  02/25/2011  T:  02/25/2011  Job:  [id number]  Electronically Signed by Fabienne Bruns MD on 03/03/2011 01:00:49 PM

## 2011-04-01 ENCOUNTER — Encounter: Payer: Self-pay | Admitting: Thoracic Diseases

## 2011-04-02 ENCOUNTER — Ambulatory Visit (INDEPENDENT_AMBULATORY_CARE_PROVIDER_SITE_OTHER): Payer: BC Managed Care – PPO | Admitting: Thoracic Diseases

## 2011-04-02 ENCOUNTER — Other Ambulatory Visit: Payer: Self-pay | Admitting: *Deleted

## 2011-04-02 ENCOUNTER — Encounter: Payer: Self-pay | Admitting: Thoracic Diseases

## 2011-04-02 VITALS — BP 140/91 | HR 103 | Resp 20 | Ht 70.0 in | Wt 194.0 lb

## 2011-04-02 DIAGNOSIS — N186 End stage renal disease: Secondary | ICD-10-CM

## 2011-04-02 NOTE — Progress Notes (Signed)
[facility] [place]  HISTORY AND PHYSICAL  History of Present Illness: Patient is a 30 y.o. year old male who presents for placement of a permanent hemodialysis access. The patient is right handed  . The patient is not currently on hemodialysis. The cause of renal failure is thought to be secondary to diabetes. Other chronic medical problems include hyperlipidemia, anemia, peripheral neuropathy and retinopathy. These are currently controlled. He had a radiocephalic AVF placed by Dr. Darrick Penna on 10/ 24/12 which is not functioning he is being admitted for a left arm Brachiocephalic AVF.  Past Medical History   Diagnosis  Date   .  Diabetes mellitus    .  Hyperlipidemia    .  Chronic kidney disease    .  Peripheral neuropathy    .  Retinopathy    .  Dyslipidemia    .  Swelling of extremity    .  Anemia     Past Surgical History   Procedure  Date   .  Eye surgery      Bilateral diabetic retina problems    ROS: [x]  Positive [ ]  Negative [x ] All sytems reviewed and are negative  General:[ ]  Weight loss, [ ]  Fever, [ ]  chills  Neurologic: [ ]  Dizziness, [ ]  Blackouts, [ ]  Seizure  [ ]  Stroke, [ ]  "Mini stroke", [ ]  Slurred speech, [ ]  Temporary blindness; [ ] weakness, [ ]  Hoarseness  Cardiac: [ ]  Chest pain/pressure, [ ]  Shortness of breath at rest [ ]  Shortness of breath with exertion, [ ]  Atrial fibrillation or irregular heartbeat  Vascular:[ ]  Pain in legs with walking, [ ]  Pain in legs at rest ,[ ]  Pain in legs at night,  [ ]  Non-healing ulcer, [ ]  Blood clot in vein/DVT,  Pulmonary: [ ]  Home oxygen, [ ]  Productive cough, [ ]  Coughing up blood, [ ]  Asthma,  [ ]  Wheezing  Musculoskeletal: [ ]  Arthritis, [ ]  Low back pain, [ ]  Joint pain  Hematologic:[ ]  Easy Bruising, [ ]  Anemia; [ ]  Hepatitis  Gastrointestinal: [ ]  Blood in stool, [ ]  Gastroesophageal Reflux, [ ]  Trouble swallowing  Urinary: [ ]  chronic Kidney disease, [ ]  on HD - [ ]  MWF or [ ]  TTHS, [ ]  Burning  with urination, [ ]  Frequent urination, [ ]  Difficulty urinating;  Skin: [ ]  Rashes, [ ]  Wounds  Psychological: [ ]  Anxiety, [ ]  Depression  Social History  History   Substance Use Topics   .  Smoking status:  Never Smoker   .  Smokeless tobacco:  Not on file   .  Alcohol Use:  No    Family History  Family History   Problem  Relation  Age of Onset   .  Kidney disease  Maternal Aunt    .  Kidney disease  Maternal Grandmother     Allergies  No Known Allergies  Current Outpatient Prescriptions   Medication  Sig  Dispense  Refill   .  losartin 100 mg daily    .  furosemide (LASIX) 40 MG tablet  Take 40 mg by mouth daily.     .  insulin glargine (LANTUS) 100 UNIT/ML injection  Inject 15 Units into the skin at bedtime.     .  insulin lispro (HUMALOG) 100 UNIT/ML injection  Inject 55 Units into the skin 3 (three) times daily before meals.     Marland Kitchen  labetalol (NORMODYNE) 300 MG tablet  Take 300 mg  by mouth 2 (two) times daily.     .  paricalcitol (ZEMPLAR) 1 MCG capsule  Take 1 mcg by mouth every Monday, Wednesday, and Friday.     .  rosuvastatin (CRESTOR) 10 MG tablet  Take 10 mg by mouth daily.      Physical Examination    Filed Vitals:   04/02/11 1408  BP: 140/91  Pulse: 103  Resp: 20  Height: 5\' 10"  (1.778 m)  Weight: 194 lb (87.998 kg)   Body mass index is 28.70 kg/(m^2).  General: Alert and oriented, no acute distress  HEENT: Normal  Neck: No bruit or JVD  Pulmonary: Clear to auscultation bilaterally  Cardiac: Regular Rate and Rhythm without murmur  Gastrointestinal: Soft, non-tender, non-distended, no mass, no scars  Skin: No rash  Extremity Pulses: 2+ radial, brachial pulses bilaterally  Musculoskeletal: No deformity or edema  Neurologic: Upper and lower extremity motor 5/5 and symmetric  DATA: Vein mapping ultrasound of both extremities was evaluated today images were reviewed and interpreted. Left side cephalic vein is approximately 40 mm in diameter the forearm and  40-60 mm in diameter in the upper arm right arm shows 30 mm in diameter in the forearm 50 mm in the upper basilic vein was between 30 and 70 mm bilaterally  ASSESSMENT:  Patient needs long-term hemodialysis access. The left radiocephalic AV fistula placed 02/25/11 is not functioning 2nd to small radial artery. We will place a Left Brachiocephalic AVF. Risks benefits possible complications and procedure details were explained the patient today including but not limited to bleeding infection ischemic steal non-maturation of the fistula.  PLAN: left brachiocephalic AV fistula on 04/08/2011  Clinic MD: CE Darrick Penna, MD

## 2011-04-03 DIAGNOSIS — N186 End stage renal disease: Secondary | ICD-10-CM

## 2011-04-07 ENCOUNTER — Encounter (HOSPITAL_COMMUNITY): Payer: Self-pay

## 2011-04-07 MED ORDER — DEXTROSE 5 % IV SOLN
1.5000 g | INTRAVENOUS | Status: AC
  Administered 2011-04-08: 1.5 g via INTRAVENOUS
  Filled 2011-04-07 (×3): qty 1.5

## 2011-04-08 ENCOUNTER — Ambulatory Visit (HOSPITAL_COMMUNITY): Payer: BC Managed Care – PPO | Admitting: Certified Registered"

## 2011-04-08 ENCOUNTER — Encounter (HOSPITAL_COMMUNITY): Payer: Self-pay | Admitting: *Deleted

## 2011-04-08 ENCOUNTER — Encounter (HOSPITAL_COMMUNITY): Payer: Self-pay | Admitting: Certified Registered"

## 2011-04-08 ENCOUNTER — Ambulatory Visit (HOSPITAL_COMMUNITY)
Admission: RE | Admit: 2011-04-08 | Discharge: 2011-04-08 | Disposition: A | Discharge status: Home / Self Care | Payer: BC Managed Care – PPO | Source: Ambulatory Visit | Attending: Vascular Surgery | Admitting: Vascular Surgery

## 2011-04-08 ENCOUNTER — Encounter (HOSPITAL_COMMUNITY)
Admission: RE | Disposition: A | Discharge status: Home / Self Care | Payer: Self-pay | Source: Ambulatory Visit | Attending: Vascular Surgery

## 2011-04-08 DIAGNOSIS — Z992 Dependence on renal dialysis: Secondary | ICD-10-CM | POA: Insufficient documentation

## 2011-04-08 DIAGNOSIS — I120 Hypertensive chronic kidney disease with stage 5 chronic kidney disease or end stage renal disease: Secondary | ICD-10-CM | POA: Insufficient documentation

## 2011-04-08 DIAGNOSIS — N186 End stage renal disease: Secondary | ICD-10-CM

## 2011-04-08 DIAGNOSIS — E1129 Type 2 diabetes mellitus with other diabetic kidney complication: Secondary | ICD-10-CM | POA: Insufficient documentation

## 2011-04-08 DIAGNOSIS — H3500 Unspecified background retinopathy: Secondary | ICD-10-CM | POA: Insufficient documentation

## 2011-04-08 DIAGNOSIS — E785 Hyperlipidemia, unspecified: Secondary | ICD-10-CM | POA: Insufficient documentation

## 2011-04-08 DIAGNOSIS — E1142 Type 2 diabetes mellitus with diabetic polyneuropathy: Secondary | ICD-10-CM | POA: Insufficient documentation

## 2011-04-08 HISTORY — PX: AV FISTULA PLACEMENT: SHX1204

## 2011-04-08 LAB — GLUCOSE, CAPILLARY: Glucose-Capillary: 123 mg/dL — ABNORMAL HIGH (ref 70–99)

## 2011-04-08 SURGERY — ARTERIOVENOUS (AV) FISTULA CREATION
Anesthesia: General | Site: Arm Upper | Laterality: Left | Wound class: Clean

## 2011-04-08 MED ORDER — SODIUM CHLORIDE 0.9 % IR SOLN
Status: DC | PRN
  Administered 2011-04-08: 09:00:00

## 2011-04-08 MED ORDER — FENTANYL CITRATE 0.05 MG/ML IJ SOLN
INTRAMUSCULAR | Status: DC | PRN
  Administered 2011-04-08 (×2): 50 ug via INTRAVENOUS

## 2011-04-08 MED ORDER — SODIUM CHLORIDE 0.9 % IR SOLN
Status: DC | PRN
  Administered 2011-04-08: 1000 mL

## 2011-04-08 MED ORDER — PROPOFOL 10 MG/ML IV EMUL
INTRAVENOUS | Status: DC | PRN
  Administered 2011-04-08: 200 mg via INTRAVENOUS

## 2011-04-08 MED ORDER — ONDANSETRON HCL 4 MG/2ML IJ SOLN
INTRAMUSCULAR | Status: DC | PRN
  Administered 2011-04-08: 4 mg via INTRAVENOUS

## 2011-04-08 MED ORDER — MUPIROCIN 2 % EX OINT
TOPICAL_OINTMENT | CUTANEOUS | Status: AC
  Filled 2011-04-08: qty 22

## 2011-04-08 MED ORDER — HEPARIN SODIUM (PORCINE) 1000 UNIT/ML IJ SOLN
INTRAMUSCULAR | Status: DC | PRN
  Administered 2011-04-08: 5000 [IU] via INTRAVENOUS

## 2011-04-08 MED ORDER — MORPHINE SULFATE 2 MG/ML IJ SOLN: 0.0500 mg/kg | INTRAMUSCULAR | Status: DC | PRN

## 2011-04-08 MED ORDER — MUPIROCIN 2 % EX OINT: TOPICAL_OINTMENT | Freq: Two times a day (BID) | CUTANEOUS | Status: DC

## 2011-04-08 MED ORDER — SODIUM CHLORIDE 0.9 % IV SOLN: INTRAVENOUS | Status: DC

## 2011-04-08 MED ORDER — ACETAMINOPHEN-CODEINE #3 300-30 MG PO TABS: 1.0000 | ORAL_TABLET | ORAL | Status: AC | PRN

## 2011-04-08 MED ORDER — HYDROMORPHONE HCL PF 1 MG/ML IJ SOLN
0.2500 mg | INTRAMUSCULAR | Status: DC | PRN
  Administered 2011-04-08: 0.25 mg via INTRAVENOUS

## 2011-04-08 MED ORDER — PHENYLEPHRINE HCL 10 MG/ML IJ SOLN
INTRAMUSCULAR | Status: DC | PRN
  Administered 2011-04-08 (×2): 120 ug via INTRAVENOUS

## 2011-04-08 MED ORDER — PROMETHAZINE HCL 25 MG/ML IJ SOLN: 6.2500 mg | INTRAMUSCULAR | Status: DC | PRN

## 2011-04-08 MED ORDER — MIDAZOLAM HCL 2 MG/2ML IJ SOLN: 0.5000 mg | Freq: Once | INTRAMUSCULAR | Status: DC | PRN

## 2011-04-08 MED ORDER — SODIUM CHLORIDE 0.9 % IV SOLN
INTRAVENOUS | Status: DC | PRN
  Administered 2011-04-08 (×2): via INTRAVENOUS

## 2011-04-08 MED ORDER — MEPERIDINE HCL 25 MG/ML IJ SOLN: 6.2500 mg | INTRAMUSCULAR | Status: DC | PRN

## 2011-04-08 SURGICAL SUPPLY — 45 items
ADH SKN CLS APL DERMABOND .7 (GAUZE/BANDAGES/DRESSINGS) ×1
CANISTER SUCTION 2500CC (MISCELLANEOUS) ×2 IMPLANT
CLIP TI MEDIUM 6 (CLIP) ×2 IMPLANT
CLIP TI WIDE RED SMALL 24 (CLIP) ×1 IMPLANT
CLIP TI WIDE RED SMALL 6 (CLIP) ×2 IMPLANT
CLOTH BEACON ORANGE TIMEOUT ST (SAFETY) ×2 IMPLANT
COVER PROBE W GEL 5X96 (DRAPES) ×2 IMPLANT
COVER SURGICAL LIGHT HANDLE (MISCELLANEOUS) ×4 IMPLANT
DECANTER SPIKE VIAL GLASS SM (MISCELLANEOUS) ×2 IMPLANT
DERMABOND ADVANCED (GAUZE/BANDAGES/DRESSINGS) ×1
DERMABOND ADVANCED .7 DNX12 (GAUZE/BANDAGES/DRESSINGS) ×1 IMPLANT
DRAIN PENROSE 1/4X12 LTX STRL (WOUND CARE) ×2 IMPLANT
ELECT REM PT RETURN 9FT ADLT (ELECTROSURGICAL) ×2
ELECTRODE REM PT RTRN 9FT ADLT (ELECTROSURGICAL) ×1 IMPLANT
GAUZE SPONGE 2X2 8PLY STRL LF (GAUZE/BANDAGES/DRESSINGS) ×1 IMPLANT
GEL ULTRASOUND 20GR AQUASONIC (MISCELLANEOUS) IMPLANT
GLOVE BIO SURGEON STRL SZ7.5 (GLOVE) ×2 IMPLANT
GLOVE BIOGEL PI IND STRL 6.5 (GLOVE) IMPLANT
GLOVE BIOGEL PI IND STRL 7.0 (GLOVE) IMPLANT
GLOVE BIOGEL PI IND STRL 7.5 (GLOVE) IMPLANT
GLOVE BIOGEL PI INDICATOR 6.5 (GLOVE) ×2
GLOVE BIOGEL PI INDICATOR 7.0 (GLOVE) ×2
GLOVE BIOGEL PI INDICATOR 7.5 (GLOVE) ×1
GLOVE SS BIOGEL STRL SZ 7 (GLOVE) IMPLANT
GLOVE SUPERSENSE BIOGEL SZ 7 (GLOVE) ×2
GOWN BRE IMP SLV SIRUS LXLNG (GOWN DISPOSABLE) ×2 IMPLANT
GOWN PREVENTION PLUS XLARGE (GOWN DISPOSABLE) ×3 IMPLANT
GOWN STRL NON-REIN LRG LVL3 (GOWN DISPOSABLE) ×4 IMPLANT
KIT BASIN OR (CUSTOM PROCEDURE TRAY) ×2 IMPLANT
KIT ROOM TURNOVER OR (KITS) ×2 IMPLANT
LOOP VESSEL MINI RED (MISCELLANEOUS) IMPLANT
NS IRRIG 1000ML POUR BTL (IV SOLUTION) ×2 IMPLANT
PACK CV ACCESS (CUSTOM PROCEDURE TRAY) ×2 IMPLANT
PAD ARMBOARD 7.5X6 YLW CONV (MISCELLANEOUS) ×4 IMPLANT
SPONGE GAUZE 2X2 STER 10/PKG (GAUZE/BANDAGES/DRESSINGS) ×1
SPONGE SURGIFOAM ABS GEL 100 (HEMOSTASIS) IMPLANT
SUT PROLENE 6 0 BV (SUTURE) ×1 IMPLANT
SUT PROLENE 7 0 BV 1 (SUTURE) ×2 IMPLANT
SUT VIC AB 3-0 SH 27 (SUTURE) ×2
SUT VIC AB 3-0 SH 27X BRD (SUTURE) ×1 IMPLANT
SUT VICRYL 4-0 PS2 18IN ABS (SUTURE) ×2 IMPLANT
TOWEL OR 17X24 6PK STRL BLUE (TOWEL DISPOSABLE) ×2 IMPLANT
TOWEL OR 17X26 10 PK STRL BLUE (TOWEL DISPOSABLE) ×2 IMPLANT
UNDERPAD 30X30 INCONTINENT (UNDERPADS AND DIAPERS) ×2 IMPLANT
WATER STERILE IRR 1000ML POUR (IV SOLUTION) ×2 IMPLANT

## 2011-04-08 NOTE — Op Note (Signed)
Procedure: Left Brachial Cephalic AV fistula  Preop: ESRD  Postop: ESRD  Anesthesia: MAC with local  Assistant:Tina Ebony Cargo, RNFA  Findings:4 mm cephalic vein, calcified thickened left brachial artery   Procedure Details: The left upper extremity was prepped and draped in usual sterile fashion.  A transverse incision was then made near the antecubital crease the left arm. The incision was carried into the subcutaneous tissues down to level cephalic vein. Cephalic vein was approximately 4 mm in diameter. It was of good quality. This was dissected free circumferentially and small side branches ligated and divided between silk ties. Next the brachial artery was dissected free in the medial portion incision. The artery was  3-4 mm in diameter. It was pulsatile but was quite thickened and calcified.  The vessel loops were placed proximal and distal to the planned site of arteriotomy. The patient was given 5000 units of intravenous heparin. After appropriate circulation time, the vessel loops were used to control the artery. A longitudinal opening was made in the left brachial artery the vein was ligated distally with a 2-0 silk tie. The vein is controlled proximally with a fine bulldog clamp. The vein was then swung over to the artery and sewn end of vein to side of artery using a running 7-0 Prolene suture. Just prior to completion anastomosis was fore bled back bled and thoroughly flushed. The anastomosis was secured, vessel loops released, and there was a palpable thrill in the fistula immediately. After hemostasis was obtained, the subcutaneous tissues were reapproximated using a running 3-0 Vicryl suture. The skin was then closed with a 4 Vicryl subcuticular stitch. Dermabond was applied to the skin incision.    The patient tolerated the procedure well and there were no complications.  The patient was taken to the PACU in stable condition.

## 2011-04-08 NOTE — Preoperative (Signed)
Beta Blockers   Reason not to administer Beta Blockers:Not Applicable 

## 2011-04-08 NOTE — Transfer of Care (Signed)
Immediate Anesthesia Transfer of Care Note  Patient: Derek Johnston  Procedure(s) Performed:  ARTERIOVENOUS (AV) FISTULA CREATION - Creation of left upper arm arteriovenous fistula  Patient Location: PACU  Anesthesia Type: General  Level of Consciousness: awake, alert  and oriented  Airway & Oxygen Therapy: Patient Spontanous Breathing  Post-op Assessment: Report given to PACU RN  Post vital signs: Reviewed and stable  Complications: No apparent anesthesia complications

## 2011-04-08 NOTE — Anesthesia Postprocedure Evaluation (Signed)
  Anesthesia Post-op Note  Patient: Derek Johnston  Procedure(s) Performed:  ARTERIOVENOUS (AV) FISTULA CREATION - Creation of left upper arm arteriovenous fistula  Patient Location: PACU  Anesthesia Type: General  Level of Consciousness: awake  Airway and Oxygen Therapy: Patient Spontanous Breathing  Post-op Pain: mild  Post-op Assessment: Post-op Vital signs reviewed  Post-op Vital Signs: stable  Complications: No apparent anesthesia complications

## 2011-04-08 NOTE — Anesthesia Preprocedure Evaluation (Signed)
Anesthesia Evaluation  Patient identified by MRN, date of birth, ID band Patient awake    Reviewed: Allergy & Precautions, H&P , NPO status , Patient's Chart, lab work & pertinent test results  Airway Mallampati: II TM Distance: >3 FB Neck ROM: Full  Mouth opening: Limited Mouth Opening  Dental   Pulmonary    Pulmonary exam normal       Cardiovascular hypertension,     Neuro/Psych    GI/Hepatic   Endo/Other  Diabetes mellitus-  Renal/GU Renal disease     Musculoskeletal   Abdominal   Peds  Hematology   Anesthesia Other Findings   Reproductive/Obstetrics                           Anesthesia Physical Anesthesia Plan  ASA: III  Anesthesia Plan: General   Post-op Pain Management:    Induction: Intravenous  Airway Management Planned: LMA  Additional Equipment:   Intra-op Plan:   Post-operative Plan: Extubation in OR  Informed Consent: I have reviewed the patients History and Physical, chart, labs and discussed the procedure including the risks, benefits and alternatives for the proposed anesthesia with the patient or authorized representative who has indicated his/her understanding and acceptance.     Plan Discussed with: CRNA and Surgeon  Anesthesia Plan Comments:         Anesthesia Quick Evaluation

## 2011-04-08 NOTE — Progress Notes (Signed)
Patient ID: Derek BASTIN, male   DOB: 1981-03-11, 30 y.o.   MRN: [id number]   History of Present Illness: Patient is a 30 y.o. year old male who presents for placement of a permanent hemodialysis access. The patient is right handed  . The patient is not currently on hemodialysis. The cause of renal failure is thought to be secondary to diabetes. Other chronic medical problems include hyperlipidemia, anemia, peripheral neuropathy and retinopathy. These are currently controlled. He had a radiocephalic AVF placed by Dr. Darrick Penna on 10/ 24/12 which is not functioning he is being admitted for a left arm Brachiocephalic AVF.  Past Medical History   Diagnosis  Date   .  Diabetes mellitus    .  Hyperlipidemia    .  Chronic kidney disease    .  Peripheral neuropathy    .  Retinopathy    .  Dyslipidemia    .  Swelling of extremity    .  Anemia     Past Surgical History   Procedure  Date   .  Eye surgery      Bilateral diabetic retina problems   ROS: [x]  Positive [ ]  Negative [x ] All sytems reviewed and are negative  General:[ ]  Weight loss, [ ]  Fever, [ ]  chills  Neurologic: [ ]  Dizziness, [ ]  Blackouts, [ ]  Seizure  [ ]  Stroke, [ ]  "Mini stroke", [ ]  Slurred speech, [ ]  Temporary blindness; [ ] weakness, [ ]  Hoarseness  Cardiac: [ ]  Chest pain/pressure, [ ]  Shortness of breath at rest [ ]  Shortness of breath with exertion, [ ]  Atrial fibrillation or irregular heartbeat  Vascular:[ ]  Pain in legs with walking, [ ]  Pain in legs at rest ,[ ]  Pain in legs at night,  [ ]  Non-healing ulcer, [ ]  Blood clot in vein/DVT,  Pulmonary: [ ]  Home oxygen, [ ]  Productive cough, [ ]  Coughing up blood, [ ]  Asthma,  [ ]  Wheezing  Musculoskeletal: [ ]  Arthritis, [ ]  Low back pain, [ ]  Joint pain  Hematologic:[ ]  Easy Bruising, [ ]  Anemia; [ ]  Hepatitis  Gastrointestinal: [ ]  Blood in stool, [ ]  Gastroesophageal Reflux, [ ]  Trouble swallowing  Urinary: [ ]  chronic Kidney disease, [ ]  on HD - [ ]  MWF or [ ]  TTHS, [ ]   Burning with urination, [ ]  Frequent urination, [ ]  Difficulty urinating;  Skin: [ ]  Rashes, [ ]  Wounds  Psychological: [ ]  Anxiety, [ ]  Depression  Social History  History   Substance Use Topics   .  Smoking status:  Never Smoker   .  Smokeless tobacco:  Not on file   .  Alcohol Use:  No   Family History  Family History   Problem  Relation  Age of Onset   .  Kidney disease  Maternal Aunt    .  Kidney disease  Maternal Grandmother    Allergies  No Known Allergies  Current Outpatient Prescriptions   Medication  Sig  Dispense  Refill   .  losartin  100 mg daily     .  furosemide (LASIX) 40 MG tablet  Take 40 mg by mouth daily.     .  insulin glargine (LANTUS) 100 UNIT/ML injection  Inject 15 Units into the skin at bedtime.     .  insulin lispro (HUMALOG) 100 UNIT/ML injection  Inject 55 Units into the skin 3 (three) times daily before meals.     Marland Kitchen  labetalol (NORMODYNE) 300  MG tablet  Take 300 mg by mouth 2 (two) times daily.     .  paricalcitol (ZEMPLAR) 1 MCG capsule  Take 1 mcg by mouth every Monday, Wednesday, and Friday.     .  rosuvastatin (CRESTOR) 10 MG tablet  Take 10 mg by mouth daily.     Physical Examination  Filed Vitals:    04/02/11 1408   BP:  140/91   Pulse:  103   Resp:  20   Height:  5\' 10"  (1.778 m)   Weight:  194 lb (87.998 kg)    Body mass index is 28.70 kg/(m^2).  General: Alert and oriented, no acute distress  HEENT: Normal  Neck: No bruit or JVD  Pulmonary: Clear to auscultation bilaterally  Cardiac: Regular Rate and Rhythm without murmur  Gastrointestinal: Soft, non-tender, non-distended, no mass, no scars  Skin: No rash  Extremity Pulses: 2+ radial, brachial pulses bilaterally  Musculoskeletal: No deformity or edema  Neurologic: Upper and lower extremity motor 5/5 and symmetric  DATA: Vein mapping ultrasound of both extremities was evaluated today images were reviewed and interpreted. Left side cephalic vein is approximately 40 mm in diameter the  forearm and 40-60 mm in diameter in the upper arm right arm shows 30 mm in diameter in the forearm 50 mm in the upper basilic vein was between 30 and 70 mm bilaterally  ASSESSMENT:  Patient needs long-term hemodialysis access. The left radiocephalic AV fistula placed 02/25/11 is not functioning 2nd to small radial artery. We will place a Left Brachiocephalic AVF. Risks benefits possible complications and procedure details were explained the patient today including but not limited to bleeding infection ischemic steal non-maturation of the fistula.  PLAN: left brachiocephalic AV fistula on 04/08/2011  Clinic MD: CE Fields, MD  The patient has been re-examined.  The patient's history and physical has been reviewed and is unchanged.  There is no change in the plan of care.  Fabienne Bruns, MD

## 2011-04-08 NOTE — Transfer of Care (Signed)
Immediate Anesthesia Transfer of Care Note  Patient: Derek Johnston  Procedure(s) Performed:  ARTERIOVENOUS (AV) FISTULA CREATION - Creation of left upper arm arteriovenous fistula  Patient Location: PACU  Anesthesia Type: General  Level of Consciousness: awake, alert  and oriented  Airway & Oxygen Therapy: Patient Spontanous Breathing and Patient connected to nasal cannula oxygen  Post-op Assessment: Report given to PACU RN  Post vital signs: Reviewed and stable  Complications: No apparent anesthesia complications

## 2011-04-08 NOTE — Progress Notes (Signed)
Attempted to reach Dr. Gypsy Balsam regarding PACU orders. Unsuccessful.  Dr. Randa Evens available to address PACU orders.

## 2011-04-08 NOTE — Progress Notes (Signed)
Iv site in right hand, site unremarkable,dressing cd/i

## 2011-04-09 LAB — POCT I-STAT 4, (NA,K, GLUC, HGB,HCT): Sodium: 138 mEq/L (ref 135–145)

## 2011-04-10 ENCOUNTER — Encounter (HOSPITAL_COMMUNITY): Payer: Self-pay | Admitting: Vascular Surgery

## 2011-04-21 DIAGNOSIS — N186 End stage renal disease: Secondary | ICD-10-CM

## 2011-05-14 ENCOUNTER — Ambulatory Visit: Payer: BC Managed Care – PPO

## 2011-05-15 ENCOUNTER — Ambulatory Visit: Payer: BC Managed Care – PPO

## 2011-05-19 ENCOUNTER — Encounter: Payer: Self-pay | Admitting: Thoracic Diseases

## 2011-05-20 ENCOUNTER — Ambulatory Visit (INDEPENDENT_AMBULATORY_CARE_PROVIDER_SITE_OTHER): Payer: PRIVATE HEALTH INSURANCE | Admitting: Thoracic Diseases

## 2011-05-20 VITALS — BP 186/125 | HR 110 | Resp 20

## 2011-05-20 DIAGNOSIS — N186 End stage renal disease: Secondary | ICD-10-CM

## 2011-05-20 NOTE — Progress Notes (Signed)
[facility]   Postoperative Visit hemodialysis access   Date of Surgery: 04/08/11 left Brachio-cephalic AVF Surgeon: Johny Sax, MD Nephrologist Dr. Hyman Hopes  HPI: Derek Johnston is a 31 y.o. male who is 6 weeks S/P creation/revision of left upper extremity Hemodialysis access. The patient denies symptoms of numbness, tingling, weakness and denies pain in the operative limb. Patient is here for post -op evaluation to assess healing and maturation of left AVF .  Pt is not on hemodialysis yet. His brother is a Microbiologist for a transplant.  He is out of his BP meds and did not take them today but is picking up refills at the pharmacy today  Physical Examination  Filed Vitals:   05/20/11 1606  BP: 186/125  Pulse: 110  Resp: 20    WDWN male in NAD.  left upper extremity Incision is healed Skin color is normal, no cyanosis, jaundice, pallor or bruising   Hand grip is 5/5 and sensation in digits is intact; There is a good thrill and good bruit in the AVF. The graft/fistula is easily palpable and of adequate size  Assessment/Plan Derek Johnston is a 31 y.o. year old who is s/p creation/revision of left upper extremity Hemodialysis access. Follow-up as needed  The patient's access will be ready for use in 6 weeks.  Clinic MD: CS Edilia Bo, MD

## 2011-06-29 ENCOUNTER — Ambulatory Visit (INDEPENDENT_AMBULATORY_CARE_PROVIDER_SITE_OTHER): Payer: BC Managed Care – PPO | Admitting: Ophthalmology

## 2011-07-20 DIAGNOSIS — E11319 Type 2 diabetes mellitus with unspecified diabetic retinopathy without macular edema: Secondary | ICD-10-CM | POA: Insufficient documentation

## 2011-07-20 DIAGNOSIS — G629 Polyneuropathy, unspecified: Secondary | ICD-10-CM | POA: Insufficient documentation

## 2011-10-15 ENCOUNTER — Other Ambulatory Visit: Payer: Self-pay | Admitting: *Deleted

## 2011-10-22 ENCOUNTER — Encounter (HOSPITAL_COMMUNITY)
Admission: RE | Disposition: A | Discharge status: Home / Self Care | Payer: Self-pay | Source: Ambulatory Visit | Attending: Vascular Surgery

## 2011-10-22 ENCOUNTER — Ambulatory Visit (HOSPITAL_COMMUNITY)
Admission: RE | Admit: 2011-10-22 | Discharge: 2011-10-22 | Disposition: A | Discharge status: Home / Self Care | Payer: BC Managed Care – PPO | Source: Ambulatory Visit | Attending: Vascular Surgery | Admitting: Vascular Surgery

## 2011-10-22 DIAGNOSIS — E119 Type 2 diabetes mellitus without complications: Secondary | ICD-10-CM | POA: Insufficient documentation

## 2011-10-22 DIAGNOSIS — Y832 Surgical operation with anastomosis, bypass or graft as the cause of abnormal reaction of the patient, or of later complication, without mention of misadventure at the time of the procedure: Secondary | ICD-10-CM | POA: Insufficient documentation

## 2011-10-22 DIAGNOSIS — G609 Hereditary and idiopathic neuropathy, unspecified: Secondary | ICD-10-CM | POA: Insufficient documentation

## 2011-10-22 DIAGNOSIS — E785 Hyperlipidemia, unspecified: Secondary | ICD-10-CM | POA: Insufficient documentation

## 2011-10-22 DIAGNOSIS — T82898A Other specified complication of vascular prosthetic devices, implants and grafts, initial encounter: Secondary | ICD-10-CM

## 2011-10-22 DIAGNOSIS — N184 Chronic kidney disease, stage 4 (severe): Secondary | ICD-10-CM | POA: Insufficient documentation

## 2011-10-22 HISTORY — PX: SHUNTOGRAM: SHX5491

## 2011-10-22 LAB — POCT I-STAT, CHEM 8
Calcium, Ion: 1.17 mmol/L (ref 1.12–1.32)
Chloride: 102 mEq/L (ref 96–112)
Glucose, Bld: 111 mg/dL — ABNORMAL HIGH (ref 70–99)
HCT: 37 % — ABNORMAL LOW (ref 39.0–52.0)
Hemoglobin: 12.6 g/dL — ABNORMAL LOW (ref 13.0–17.0)
TCO2: 28 mmol/L (ref 0–100)

## 2011-10-22 LAB — POCT I-STAT 4, (NA,K, GLUC, HGB,HCT)
Glucose, Bld: 116 mg/dL — ABNORMAL HIGH (ref 70–99)
HCT: 37 % — ABNORMAL LOW (ref 39.0–52.0)
Hemoglobin: 12.6 g/dL — ABNORMAL LOW (ref 13.0–17.0)
Sodium: 140 mEq/L (ref 135–145)

## 2011-10-22 LAB — GLUCOSE, CAPILLARY: Glucose-Capillary: 107 mg/dL — ABNORMAL HIGH (ref 70–99)

## 2011-10-22 SURGERY — ASSESSMENT, SHUNT FUNCTION, WITH CONTRAST RADIOGRAPHIC STUDY
Anesthesia: LOCAL

## 2011-10-22 MED ORDER — SODIUM CHLORIDE 0.9 % IJ SOLN: 3.0000 mL | INTRAMUSCULAR | Status: DC | PRN

## 2011-10-22 MED ORDER — HEPARIN (PORCINE) IN NACL 2-0.9 UNIT/ML-% IJ SOLN
INTRAMUSCULAR | Status: AC
  Filled 2011-10-22: qty 1000

## 2011-10-22 MED ORDER — ACETAMINOPHEN 325 MG PO TABS: 650.0000 mg | ORAL_TABLET | ORAL | Status: DC | PRN

## 2011-10-22 MED ORDER — ONDANSETRON HCL 4 MG/2ML IJ SOLN: 4.0000 mg | Freq: Four times a day (QID) | INTRAMUSCULAR | Status: DC | PRN

## 2011-10-22 MED ORDER — SODIUM CHLORIDE 0.9 % IV SOLN: 250.0000 mL | INTRAVENOUS | Status: DC | PRN

## 2011-10-22 MED ORDER — LIDOCAINE HCL (PF) 1 % IJ SOLN
INTRAMUSCULAR | Status: AC
  Filled 2011-10-22: qty 30

## 2011-10-22 MED ORDER — HYDRALAZINE HCL 20 MG/ML IJ SOLN
INTRAMUSCULAR | Status: AC
  Filled 2011-10-22: qty 1

## 2011-10-22 MED ORDER — SODIUM CHLORIDE 0.9 % IJ SOLN: 3.0000 mL | Freq: Two times a day (BID) | INTRAMUSCULAR | Status: DC

## 2011-10-22 MED ORDER — HEPARIN SODIUM (PORCINE) 1000 UNIT/ML IJ SOLN
INTRAMUSCULAR | Status: AC
  Filled 2011-10-22: qty 1

## 2011-10-22 MED ORDER — HYDRALAZINE HCL 20 MG/ML IJ SOLN: 10.0000 mg | INTRAMUSCULAR | Status: DC | PRN

## 2011-10-22 NOTE — H&P (Signed)
[facility] [place]  Brief History and Physical  History of Present Illness  Derek Johnston is a 31 y.o. male who presents with chief complaint: malfunctioning L BC AVF.  The patient presents today for L arm fistulogram, possible intervention.    Past Medical History  Diagnosis Date  . Diabetes mellitus   . Hyperlipidemia   . Peripheral neuropathy   . Retinopathy   . Dyslipidemia   . Swelling of extremity   . Anemia   . Chronic kidney disease     stage 4     Past Surgical History  Procedure Date  . Eye surgery     Bilateral diabetic retina problems  . Arteriovenous graft placement   . Av fistula placement 04/08/2011    Procedure: ARTERIOVENOUS (AV) FISTULA CREATION;  Surgeon: Sherren Kerns, MD;  Location: [facility] OR;  Service: Vascular;  Laterality: Left;  Creation of left upper arm arteriovenous fistula    History   Social History  . Marital Status: Single    Spouse Name: N/A    Number of Children: N/A  . Years of Education: N/A   Occupational History  . Not on file.   Social History Main Topics  . Smoking status: Never Smoker   . Smokeless tobacco: Never Used  . Alcohol Use: No  . Drug Use: No  . Sexually Active: Yes   Other Topics Concern  . Not on file   Social History Narrative  . No narrative on file    Family History  Problem Relation Age of Onset  . Kidney disease Maternal Aunt   . Kidney disease Maternal Grandmother     No current facility-administered medications on file prior to encounter.   Current Outpatient Prescriptions on File Prior to Encounter  Medication Sig Dispense Refill  . amLODipine (NORVASC) 10 MG tablet Take 10 mg by mouth daily.      . insulin glargine (LANTUS) 100 UNIT/ML injection Inject 15 Units into the skin at bedtime.        . insulin lispro (HUMALOG) 100 UNIT/ML injection Inject 2-10 Units into the skin 3 (three) times daily before meals. Per sliding scale and carb count      . labetalol  (NORMODYNE) 300 MG tablet Take 300 mg by mouth 2 (two) times daily.          No Known Allergies  Review of Systems: As listed above, otherwise negative.  Physical Examination  Filed Vitals:   10/22/11 0602  BP: 158/101  Pulse: 85  Temp: 96 F (35.6 C)  TempSrc: Oral  Resp: 18  Height: 5\' 10"  (1.778 m)  Weight: 185 lb (83.915 kg)  SpO2: 97%    General: A&O x 3, WDWN  Pulmonary: Sym exp, good air movt, CTAB, no rales, rhonchi, & wheezing  Cardiac: RRR, Nl S1, S2, no Murmurs, rubs or gallops  Gastrointestinal: soft, NTND, -G/R, - HSM, - masses, - CVAT B  Musculoskeletal: M/S 5/5 throughout , Extremities without ischemic changes , palpable thrill, +strong bruit  Laboratory See iStat  Medical Decision Making  [name] is a 31 y.o. male who presents with: malfunctioning L BC AVF.   The patient is scheduled for: L arm fistulogram, possible intervention. I discussed with the patient the nature of angiographic procedures, especially the limited patencies of any endovascular intervention.  The patient is aware of that the risks of an angiographic procedure include but are not limited to: bleeding, infection, access site complications, renal failure,  embolization, rupture of vessel, dissection, possible need for emergent surgical intervention, possible need for surgical procedures to treat the patient's pathology, and stroke and death.    The patient is aware of the risks and agrees to proceed.  Leonides Sake, MD [facility] [place] Office: [phone] Pager: [phone]  10/22/2011, 7:21 AM

## 2011-10-22 NOTE — Discharge Instructions (Signed)
AV Fistula Care After Refer to this sheet in the next few weeks. These instructions provide you with information on caring for yourself after your procedure. Your caregiver may also give you more specific instructions. Your treatment has been planned according to current medical practices, but problems sometimes occur. Call your caregiver if you have any problems or questions after your procedure. HOME CARE INSTRUCTIONS   Do not drive a car or take public transportation alone.   Do not drink alcohol.   Only take medicine that has been prescribed by your caregiver.   Do not sign important papers or make important decisions.   Have a responsible person with you.   Ask your caregiver to show you how to check your access at home for a vibration (called a "thrill") or for a sound (called a "bruit" pronounced brew-ee).   Your vein will need time to enlarge and mature so needles can be inserted for dialysis. Follow your caregiver's instructions about what you need to do to make this happen.   Keep dressings clean and dry.   Keep the arm elevated above your heart. Use a pillow.   Rest.   Use the arm as usual for all activities.   Have the stitches or tape closures removed in 10 to 14 days, or as directed by your caregiver.   Do not sleep or lie on the area of the fistula or that arm. This may decrease or stop the blood flow through your fistula.   Do not allow blood pressures to be taken on this arm.   Do not allow blood drawing to be done from the graft.   Do not wear tight clothing around the access site or on the arm.   Avoid lifting heavy objects with the arm that has the fistula.   Do not use creams or lotions over the access site.  SEEK MEDICAL CARE IF:   You have a fever.   You have swelling around the fistula that gets worse, or you have new pain.   You have unusual bleeding at the fistula site or from any other area.   You have pus or other drainage at the fistula  site.   You have skin redness or red streaking on the skin around, above, or below the fistula site.   Your access site feels warm.   You have any flu-like symptoms.  SEEK IMMEDIATE MEDICAL CARE IF:   You have pain, numbness, or an unusual pale skin on the hand or on the side of your fistula.   You have dizziness or weakness that you have not had before.   You have shortness of breath.   You have chest pain.   Your fistula disconnects or breaks, and there is bleeding that cannot be easily controlled.  Call for local emergency medical help. Do not try to drive yourself to the hospital. MAKE SURE YOU  Understand these instructions.   Will watch your condition.   Will get help right away if you are not doing well or get worse.  Document Released: 04/20/2005 Document Revised: 04/09/2011 Document Reviewed: 10/08/2010 [employer] Patient Information 2012 [employer]. 

## 2011-10-22 NOTE — Op Note (Signed)
OPERATIVE NOTE   PROCEDURE: 1.  left brachiocephalic arteriovenous fistula cannulation under ultrasound guidance 2.  left arm fistulogram 3.  Venoplasty of cephalic vein x 2 (6 mm x 40 mm, 8 mm x 40 mm)  PRE-OPERATIVE DIAGNOSIS: Malfunctioning left arteriovenous fistula  POST-OPERATIVE DIAGNOSIS: same as above   SURGEON: Leonides Sake, MD  ANESTHESIA: local  ESTIMATED BLOOD LOSS: 5 cc  FINDING(S): 1. Kink in cephalic vein without significant stenosis (minimal waist with 8 mm angioplasty) 2. One significant side branch siphoning flow in mid-arm  SPECIMEN(S):  None  CONTRAST: 35 cc  INDICATIONS: Derek Johnston is a 31 y.o. male who presents with malfunctioning left brachiocephalic arteriovenous fistula.  The patient is scheduled for left arm fistulogram.  The patient is aware the risks include but are not limited to: bleeding, infection, thrombosis of the cannulated access, and possible anaphylactic reaction to the contrast.  The patient is aware of the risks of the procedure and elects to proceed forward.  DESCRIPTION: After full informed written consent was obtained, the patient was brought back to the angiography suite and placed supine upon the angiography table.  The patient was connected to monitoring equipment.  The left arm was prepped and draped in the standard fashion for a percutaneous access intervention.  Under ultrasound guidance, the left brachiocephalic arteriovenous fistula was cannulated with a micropuncture needle.  The microwire was advanced into the fistula and the needle was exchanged for the a microsheath, which was lodged 2 cm into the access.  The wire was removed and the sheath was connected to the IV extension tubing.  Hand injections were completed to image the access from the antecubitum up to the level of axilla.  The central venous structures were also imaged by hand injections.  Based on the images, this patient will need: angioplasty of the kink in the  cephalic vein.  A Benson wire was advanced into the axillary vein and the sheath was exchanged for a short 6-Fr sheath.  Based on the the imaging, a 6 mm x 40 mm angioplasty balloon was selected.  The balloon was centered around the kink and inflated to 18 atm for 1 minute.   There was no waisting on inflation.   I exchanged the balloon for a 8 mm x 40 mm angioplasty balloon.  The balloon was centered around the kink and inflated to 18 atm for 2 minutes.   I also refluxed the contrast to the arterial anastomosis to interrogate the anastomosis.  No stenosis was identified.  On completion imaging, the kink appeared the same, suggesting no significant hemodynamic stenosis.  Based on the completion imaging, no further percutaneous intervention is necessary.  This patient will need ligation of his side branches to optimize the flow within this fistula.  The wire and balloon were removed from the sheath.  A 4-0 Monocryl purse-string suture was sewn around the sheath.  The sheath was removed while tying down the suture.  A sterile bandage was applied to the puncture site.  COMPLICATIONS: NONE  CONDITION: stable  Leonides Sake, MD [facility] Office: [phone] Pager: [phone]  10/22/2011 7:23 AM

## 2011-10-28 ENCOUNTER — Other Ambulatory Visit: Payer: Self-pay | Admitting: *Deleted

## 2011-11-04 ENCOUNTER — Encounter (HOSPITAL_COMMUNITY): Payer: Self-pay | Admitting: Pharmacy Technician

## 2011-11-09 ENCOUNTER — Encounter (HOSPITAL_COMMUNITY): Payer: Self-pay | Admitting: *Deleted

## 2011-11-09 MED ORDER — DEXTROSE 5 % IV SOLN
1.5000 g | INTRAVENOUS | Status: DC
  Filled 2011-11-09: qty 1.5

## 2011-11-09 MED ORDER — SODIUM CHLORIDE 0.9 % IV SOLN: INTRAVENOUS | Status: DC

## 2011-11-09 NOTE — Progress Notes (Signed)
1540....MONDAY......Marland KitchenREGARDING PMHx....nothing has changed since he was here 3 weeks ago...Marland KitchenMarland KitchenDA

## 2011-11-10 ENCOUNTER — Ambulatory Visit (HOSPITAL_COMMUNITY)
Admission: RE | Admit: 2011-11-10 | Payer: BC Managed Care – PPO | Source: Ambulatory Visit | Admitting: Vascular Surgery

## 2011-11-10 ENCOUNTER — Encounter (HOSPITAL_COMMUNITY): Admission: RE | Payer: Self-pay | Source: Ambulatory Visit

## 2011-11-10 SURGERY — LIGATION OF COMPETING BRANCHES OF ARTERIOVENOUS FISTULA
Anesthesia: Monitor Anesthesia Care | Site: Arm Lower | Laterality: Left

## 2011-12-03 DIAGNOSIS — N039 Chronic nephritic syndrome with unspecified morphologic changes: Secondary | ICD-10-CM | POA: Diagnosis present

## 2011-12-03 DIAGNOSIS — E11319 Type 2 diabetes mellitus with unspecified diabetic retinopathy without macular edema: Secondary | ICD-10-CM | POA: Diagnosis present

## 2011-12-03 DIAGNOSIS — Z794 Long term (current) use of insulin: Secondary | ICD-10-CM | POA: Diagnosis not present

## 2011-12-03 DIAGNOSIS — Z992 Dependence on renal dialysis: Secondary | ICD-10-CM | POA: Diagnosis not present

## 2011-12-03 DIAGNOSIS — E1142 Type 2 diabetes mellitus with diabetic polyneuropathy: Secondary | ICD-10-CM | POA: Diagnosis present

## 2011-12-03 DIAGNOSIS — I120 Hypertensive chronic kidney disease with stage 5 chronic kidney disease or end stage renal disease: Secondary | ICD-10-CM | POA: Diagnosis present

## 2011-12-03 DIAGNOSIS — E1029 Type 1 diabetes mellitus with other diabetic kidney complication: Secondary | ICD-10-CM | POA: Diagnosis present

## 2011-12-03 DIAGNOSIS — N186 End stage renal disease: Secondary | ICD-10-CM | POA: Diagnosis present

## 2011-12-03 DIAGNOSIS — E875 Hyperkalemia: Secondary | ICD-10-CM | POA: Diagnosis not present

## 2011-12-03 DIAGNOSIS — E782 Mixed hyperlipidemia: Secondary | ICD-10-CM | POA: Diagnosis present

## 2011-12-03 DIAGNOSIS — E1049 Type 1 diabetes mellitus with other diabetic neurological complication: Secondary | ICD-10-CM | POA: Diagnosis present

## 2011-12-03 DIAGNOSIS — E1039 Type 1 diabetes mellitus with other diabetic ophthalmic complication: Secondary | ICD-10-CM | POA: Diagnosis present

## 2011-12-09 DIAGNOSIS — Z940 Kidney transplant status: Secondary | ICD-10-CM | POA: Insufficient documentation

## 2011-12-09 DIAGNOSIS — D899 Disorder involving the immune mechanism, unspecified: Secondary | ICD-10-CM

## 2011-12-09 DIAGNOSIS — Z9483 Pancreas transplant status: Secondary | ICD-10-CM | POA: Insufficient documentation

## 2011-12-14 DIAGNOSIS — Z79899 Other long term (current) drug therapy: Secondary | ICD-10-CM | POA: Diagnosis not present

## 2011-12-15 DIAGNOSIS — Z940 Kidney transplant status: Secondary | ICD-10-CM | POA: Diagnosis not present

## 2011-12-15 DIAGNOSIS — Z79899 Other long term (current) drug therapy: Secondary | ICD-10-CM | POA: Diagnosis not present

## 2011-12-29 DIAGNOSIS — Z79899 Other long term (current) drug therapy: Secondary | ICD-10-CM | POA: Diagnosis not present

## 2011-12-29 DIAGNOSIS — I498 Other specified cardiac arrhythmias: Secondary | ICD-10-CM | POA: Diagnosis not present

## 2011-12-30 DIAGNOSIS — Z79899 Other long term (current) drug therapy: Secondary | ICD-10-CM | POA: Diagnosis not present

## 2011-12-31 DIAGNOSIS — Z79899 Other long term (current) drug therapy: Secondary | ICD-10-CM | POA: Diagnosis not present

## 2012-01-01 DIAGNOSIS — Z940 Kidney transplant status: Secondary | ICD-10-CM | POA: Diagnosis not present

## 2012-01-01 DIAGNOSIS — Z79899 Other long term (current) drug therapy: Secondary | ICD-10-CM | POA: Diagnosis not present

## 2012-01-03 DIAGNOSIS — R509 Fever, unspecified: Secondary | ICD-10-CM | POA: Diagnosis not present

## 2012-01-03 DIAGNOSIS — I10 Essential (primary) hypertension: Secondary | ICD-10-CM | POA: Diagnosis not present

## 2012-01-03 DIAGNOSIS — R7881 Bacteremia: Secondary | ICD-10-CM | POA: Diagnosis not present

## 2012-01-04 DIAGNOSIS — Z9225 Personal history of immunosupression therapy: Secondary | ICD-10-CM | POA: Diagnosis not present

## 2012-01-04 DIAGNOSIS — R7881 Bacteremia: Secondary | ICD-10-CM | POA: Diagnosis not present

## 2012-01-04 DIAGNOSIS — Z940 Kidney transplant status: Secondary | ICD-10-CM | POA: Diagnosis not present

## 2012-01-04 DIAGNOSIS — T80211A Bloodstream infection due to central venous catheter, initial encounter: Secondary | ICD-10-CM | POA: Diagnosis not present

## 2012-01-05 DIAGNOSIS — Z79899 Other long term (current) drug therapy: Secondary | ICD-10-CM | POA: Diagnosis not present

## 2012-01-05 DIAGNOSIS — N170 Acute kidney failure with tubular necrosis: Secondary | ICD-10-CM | POA: Diagnosis not present

## 2012-01-05 DIAGNOSIS — D899 Disorder involving the immune mechanism, unspecified: Secondary | ICD-10-CM | POA: Diagnosis not present

## 2012-01-06 DIAGNOSIS — Z79899 Other long term (current) drug therapy: Secondary | ICD-10-CM | POA: Diagnosis not present

## 2012-01-06 DIAGNOSIS — B9689 Other specified bacterial agents as the cause of diseases classified elsewhere: Secondary | ICD-10-CM | POA: Diagnosis not present

## 2012-01-06 DIAGNOSIS — T80211A Bloodstream infection due to central venous catheter, initial encounter: Secondary | ICD-10-CM | POA: Diagnosis not present

## 2012-01-06 DIAGNOSIS — D899 Disorder involving the immune mechanism, unspecified: Secondary | ICD-10-CM | POA: Diagnosis not present

## 2012-01-07 ENCOUNTER — Encounter (INDEPENDENT_AMBULATORY_CARE_PROVIDER_SITE_OTHER): Payer: BC Managed Care – PPO | Admitting: Ophthalmology

## 2012-01-08 DIAGNOSIS — E1139 Type 2 diabetes mellitus with other diabetic ophthalmic complication: Secondary | ICD-10-CM | POA: Diagnosis not present

## 2012-01-08 DIAGNOSIS — D899 Disorder involving the immune mechanism, unspecified: Secondary | ICD-10-CM | POA: Diagnosis not present

## 2012-01-08 DIAGNOSIS — G589 Mononeuropathy, unspecified: Secondary | ICD-10-CM | POA: Diagnosis not present

## 2012-01-08 DIAGNOSIS — E11319 Type 2 diabetes mellitus with unspecified diabetic retinopathy without macular edema: Secondary | ICD-10-CM | POA: Diagnosis not present

## 2012-01-08 DIAGNOSIS — I10 Essential (primary) hypertension: Secondary | ICD-10-CM | POA: Diagnosis not present

## 2012-01-08 DIAGNOSIS — E860 Dehydration: Secondary | ICD-10-CM | POA: Diagnosis not present

## 2012-01-08 DIAGNOSIS — Z940 Kidney transplant status: Secondary | ICD-10-CM | POA: Diagnosis not present

## 2012-01-08 DIAGNOSIS — Z9483 Pancreas transplant status: Secondary | ICD-10-CM | POA: Diagnosis not present

## 2012-01-19 DIAGNOSIS — D899 Disorder involving the immune mechanism, unspecified: Secondary | ICD-10-CM | POA: Diagnosis not present

## 2012-01-19 DIAGNOSIS — Z940 Kidney transplant status: Secondary | ICD-10-CM | POA: Diagnosis not present

## 2012-01-19 DIAGNOSIS — T8610 Unspecified complication of kidney transplant: Secondary | ICD-10-CM | POA: Diagnosis not present

## 2012-01-19 DIAGNOSIS — Z9483 Pancreas transplant status: Secondary | ICD-10-CM | POA: Diagnosis not present

## 2012-01-19 DIAGNOSIS — T8389XA Other specified complication of genitourinary prosthetic devices, implants and grafts, initial encounter: Secondary | ICD-10-CM | POA: Diagnosis not present

## 2012-02-01 ENCOUNTER — Ambulatory Visit (INDEPENDENT_AMBULATORY_CARE_PROVIDER_SITE_OTHER): Payer: BC Managed Care – PPO | Admitting: Ophthalmology

## 2012-02-01 DIAGNOSIS — E1039 Type 1 diabetes mellitus with other diabetic ophthalmic complication: Secondary | ICD-10-CM

## 2012-02-01 DIAGNOSIS — I10 Essential (primary) hypertension: Secondary | ICD-10-CM

## 2012-02-01 DIAGNOSIS — H2510 Age-related nuclear cataract, unspecified eye: Secondary | ICD-10-CM

## 2012-02-01 DIAGNOSIS — H35039 Hypertensive retinopathy, unspecified eye: Secondary | ICD-10-CM

## 2012-02-01 DIAGNOSIS — E11359 Type 2 diabetes mellitus with proliferative diabetic retinopathy without macular edema: Secondary | ICD-10-CM

## 2012-02-01 DIAGNOSIS — E1065 Type 1 diabetes mellitus with hyperglycemia: Secondary | ICD-10-CM

## 2012-02-17 DIAGNOSIS — B349 Viral infection, unspecified: Secondary | ICD-10-CM | POA: Diagnosis not present

## 2012-02-17 DIAGNOSIS — D899 Disorder involving the immune mechanism, unspecified: Secondary | ICD-10-CM | POA: Diagnosis not present

## 2012-02-17 DIAGNOSIS — Z940 Kidney transplant status: Secondary | ICD-10-CM | POA: Diagnosis not present

## 2012-02-17 DIAGNOSIS — Z9483 Pancreas transplant status: Secondary | ICD-10-CM | POA: Diagnosis not present

## 2012-02-17 DIAGNOSIS — Z48298 Encounter for aftercare following other organ transplant: Secondary | ICD-10-CM | POA: Diagnosis not present

## 2012-03-02 DIAGNOSIS — Z940 Kidney transplant status: Secondary | ICD-10-CM | POA: Diagnosis not present

## 2012-03-02 DIAGNOSIS — Z48298 Encounter for aftercare following other organ transplant: Secondary | ICD-10-CM | POA: Diagnosis not present

## 2012-03-23 DIAGNOSIS — E872 Acidosis: Secondary | ICD-10-CM | POA: Insufficient documentation

## 2012-05-17 DIAGNOSIS — E872 Acidosis: Secondary | ICD-10-CM | POA: Diagnosis not present

## 2012-05-17 DIAGNOSIS — B349 Viral infection, unspecified: Secondary | ICD-10-CM | POA: Diagnosis not present

## 2012-05-17 DIAGNOSIS — Z79899 Other long term (current) drug therapy: Secondary | ICD-10-CM | POA: Diagnosis not present

## 2012-05-17 DIAGNOSIS — Z7982 Long term (current) use of aspirin: Secondary | ICD-10-CM | POA: Diagnosis not present

## 2012-05-17 DIAGNOSIS — I10 Essential (primary) hypertension: Secondary | ICD-10-CM | POA: Diagnosis not present

## 2012-05-17 DIAGNOSIS — E1139 Type 2 diabetes mellitus with other diabetic ophthalmic complication: Secondary | ICD-10-CM | POA: Diagnosis not present

## 2012-05-17 DIAGNOSIS — Z9483 Pancreas transplant status: Secondary | ICD-10-CM | POA: Diagnosis not present

## 2012-05-17 DIAGNOSIS — Z792 Long term (current) use of antibiotics: Secondary | ICD-10-CM | POA: Diagnosis not present

## 2012-05-17 DIAGNOSIS — E11319 Type 2 diabetes mellitus with unspecified diabetic retinopathy without macular edema: Secondary | ICD-10-CM | POA: Diagnosis not present

## 2012-05-17 DIAGNOSIS — Z48298 Encounter for aftercare following other organ transplant: Secondary | ICD-10-CM | POA: Diagnosis not present

## 2012-05-17 DIAGNOSIS — Z940 Kidney transplant status: Secondary | ICD-10-CM | POA: Diagnosis not present

## 2012-05-17 DIAGNOSIS — D899 Disorder involving the immune mechanism, unspecified: Secondary | ICD-10-CM | POA: Diagnosis not present

## 2012-05-17 DIAGNOSIS — G589 Mononeuropathy, unspecified: Secondary | ICD-10-CM | POA: Diagnosis not present

## 2012-05-19 DIAGNOSIS — R748 Abnormal levels of other serum enzymes: Secondary | ICD-10-CM | POA: Diagnosis not present

## 2012-05-19 DIAGNOSIS — Z940 Kidney transplant status: Secondary | ICD-10-CM | POA: Diagnosis not present

## 2012-05-19 DIAGNOSIS — Z9483 Pancreas transplant status: Secondary | ICD-10-CM | POA: Diagnosis not present

## 2012-05-20 DIAGNOSIS — T8610 Unspecified complication of kidney transplant: Secondary | ICD-10-CM | POA: Diagnosis not present

## 2012-05-20 DIAGNOSIS — Z09 Encounter for follow-up examination after completed treatment for conditions other than malignant neoplasm: Secondary | ICD-10-CM | POA: Diagnosis not present

## 2012-05-20 DIAGNOSIS — R748 Abnormal levels of other serum enzymes: Secondary | ICD-10-CM | POA: Diagnosis not present

## 2012-05-20 DIAGNOSIS — Z79899 Other long term (current) drug therapy: Secondary | ICD-10-CM | POA: Diagnosis not present

## 2012-05-20 DIAGNOSIS — Z940 Kidney transplant status: Secondary | ICD-10-CM | POA: Diagnosis not present

## 2012-05-20 DIAGNOSIS — R791 Abnormal coagulation profile: Secondary | ICD-10-CM | POA: Diagnosis not present

## 2012-05-20 DIAGNOSIS — Z48298 Encounter for aftercare following other organ transplant: Secondary | ICD-10-CM | POA: Diagnosis not present

## 2012-05-20 DIAGNOSIS — E119 Type 2 diabetes mellitus without complications: Secondary | ICD-10-CM | POA: Diagnosis not present

## 2012-05-20 DIAGNOSIS — R7989 Other specified abnormal findings of blood chemistry: Secondary | ICD-10-CM | POA: Diagnosis not present

## 2012-05-21 DIAGNOSIS — E872 Acidosis: Secondary | ICD-10-CM | POA: Diagnosis not present

## 2012-05-21 DIAGNOSIS — R791 Abnormal coagulation profile: Secondary | ICD-10-CM | POA: Diagnosis not present

## 2012-05-21 DIAGNOSIS — I498 Other specified cardiac arrhythmias: Secondary | ICD-10-CM | POA: Diagnosis not present

## 2012-05-21 DIAGNOSIS — E875 Hyperkalemia: Secondary | ICD-10-CM | POA: Diagnosis not present

## 2012-05-21 DIAGNOSIS — Z79899 Other long term (current) drug therapy: Secondary | ICD-10-CM | POA: Diagnosis not present

## 2012-05-21 DIAGNOSIS — Z940 Kidney transplant status: Secondary | ICD-10-CM | POA: Diagnosis not present

## 2012-05-21 DIAGNOSIS — E871 Hypo-osmolality and hyponatremia: Secondary | ICD-10-CM | POA: Diagnosis not present

## 2012-05-21 DIAGNOSIS — T8610 Unspecified complication of kidney transplant: Secondary | ICD-10-CM | POA: Diagnosis not present

## 2012-05-21 DIAGNOSIS — R748 Abnormal levels of other serum enzymes: Secondary | ICD-10-CM | POA: Diagnosis not present

## 2012-05-21 DIAGNOSIS — Z9483 Pancreas transplant status: Secondary | ICD-10-CM | POA: Diagnosis not present

## 2012-05-21 DIAGNOSIS — E119 Type 2 diabetes mellitus without complications: Secondary | ICD-10-CM | POA: Diagnosis not present

## 2012-05-21 DIAGNOSIS — R7989 Other specified abnormal findings of blood chemistry: Secondary | ICD-10-CM | POA: Diagnosis not present

## 2012-05-21 DIAGNOSIS — E878 Other disorders of electrolyte and fluid balance, not elsewhere classified: Secondary | ICD-10-CM | POA: Diagnosis not present

## 2012-05-21 DIAGNOSIS — R079 Chest pain, unspecified: Secondary | ICD-10-CM | POA: Diagnosis not present

## 2012-05-22 DIAGNOSIS — T8610 Unspecified complication of kidney transplant: Secondary | ICD-10-CM | POA: Diagnosis not present

## 2012-05-22 DIAGNOSIS — Z9483 Pancreas transplant status: Secondary | ICD-10-CM | POA: Diagnosis not present

## 2012-05-22 DIAGNOSIS — E119 Type 2 diabetes mellitus without complications: Secondary | ICD-10-CM | POA: Diagnosis not present

## 2012-05-22 DIAGNOSIS — E871 Hypo-osmolality and hyponatremia: Secondary | ICD-10-CM | POA: Diagnosis not present

## 2012-05-22 DIAGNOSIS — E872 Acidosis: Secondary | ICD-10-CM | POA: Diagnosis not present

## 2012-05-22 DIAGNOSIS — R7989 Other specified abnormal findings of blood chemistry: Secondary | ICD-10-CM | POA: Diagnosis not present

## 2012-05-22 DIAGNOSIS — R748 Abnormal levels of other serum enzymes: Secondary | ICD-10-CM | POA: Diagnosis not present

## 2012-05-22 DIAGNOSIS — Z79899 Other long term (current) drug therapy: Secondary | ICD-10-CM | POA: Diagnosis not present

## 2012-05-22 DIAGNOSIS — R791 Abnormal coagulation profile: Secondary | ICD-10-CM | POA: Diagnosis not present

## 2012-05-22 DIAGNOSIS — Z940 Kidney transplant status: Secondary | ICD-10-CM | POA: Diagnosis not present

## 2012-05-26 DIAGNOSIS — E872 Acidosis: Secondary | ICD-10-CM | POA: Diagnosis not present

## 2012-05-26 DIAGNOSIS — E782 Mixed hyperlipidemia: Secondary | ICD-10-CM | POA: Diagnosis not present

## 2012-05-26 DIAGNOSIS — B349 Viral infection, unspecified: Secondary | ICD-10-CM | POA: Diagnosis not present

## 2012-05-26 DIAGNOSIS — Z9483 Pancreas transplant status: Secondary | ICD-10-CM | POA: Diagnosis not present

## 2012-05-26 DIAGNOSIS — Z79899 Other long term (current) drug therapy: Secondary | ICD-10-CM | POA: Diagnosis not present

## 2012-05-26 DIAGNOSIS — Z7982 Long term (current) use of aspirin: Secondary | ICD-10-CM | POA: Diagnosis not present

## 2012-05-26 DIAGNOSIS — E1029 Type 1 diabetes mellitus with other diabetic kidney complication: Secondary | ICD-10-CM | POA: Diagnosis not present

## 2012-05-26 DIAGNOSIS — I10 Essential (primary) hypertension: Secondary | ICD-10-CM | POA: Diagnosis not present

## 2012-05-26 DIAGNOSIS — T8610 Unspecified complication of kidney transplant: Secondary | ICD-10-CM | POA: Diagnosis not present

## 2012-05-27 DIAGNOSIS — B349 Viral infection, unspecified: Secondary | ICD-10-CM | POA: Diagnosis not present

## 2012-05-27 DIAGNOSIS — Z940 Kidney transplant status: Secondary | ICD-10-CM | POA: Diagnosis not present

## 2012-05-27 DIAGNOSIS — T8610 Unspecified complication of kidney transplant: Secondary | ICD-10-CM | POA: Diagnosis not present

## 2012-05-27 DIAGNOSIS — E1029 Type 1 diabetes mellitus with other diabetic kidney complication: Secondary | ICD-10-CM | POA: Diagnosis not present

## 2012-05-27 DIAGNOSIS — E872 Acidosis: Secondary | ICD-10-CM | POA: Diagnosis not present

## 2012-05-27 DIAGNOSIS — Z79899 Other long term (current) drug therapy: Secondary | ICD-10-CM | POA: Diagnosis not present

## 2012-05-27 DIAGNOSIS — Z9483 Pancreas transplant status: Secondary | ICD-10-CM | POA: Diagnosis not present

## 2012-05-28 DIAGNOSIS — E1029 Type 1 diabetes mellitus with other diabetic kidney complication: Secondary | ICD-10-CM | POA: Diagnosis not present

## 2012-05-28 DIAGNOSIS — E872 Acidosis: Secondary | ICD-10-CM | POA: Diagnosis not present

## 2012-05-28 DIAGNOSIS — B349 Viral infection, unspecified: Secondary | ICD-10-CM | POA: Diagnosis not present

## 2012-05-28 DIAGNOSIS — T8610 Unspecified complication of kidney transplant: Secondary | ICD-10-CM | POA: Diagnosis not present

## 2012-05-28 DIAGNOSIS — Z9483 Pancreas transplant status: Secondary | ICD-10-CM | POA: Diagnosis not present

## 2012-05-30 DIAGNOSIS — Z9483 Pancreas transplant status: Secondary | ICD-10-CM | POA: Diagnosis not present

## 2012-05-30 DIAGNOSIS — Z940 Kidney transplant status: Secondary | ICD-10-CM | POA: Diagnosis not present

## 2012-05-30 DIAGNOSIS — B349 Viral infection, unspecified: Secondary | ICD-10-CM | POA: Diagnosis not present

## 2012-05-30 DIAGNOSIS — Z79899 Other long term (current) drug therapy: Secondary | ICD-10-CM | POA: Diagnosis not present

## 2012-05-30 DIAGNOSIS — E119 Type 2 diabetes mellitus without complications: Secondary | ICD-10-CM | POA: Diagnosis not present

## 2012-05-30 DIAGNOSIS — T8610 Unspecified complication of kidney transplant: Secondary | ICD-10-CM | POA: Diagnosis not present

## 2012-05-31 DIAGNOSIS — Z9483 Pancreas transplant status: Secondary | ICD-10-CM | POA: Diagnosis not present

## 2012-05-31 DIAGNOSIS — Z79899 Other long term (current) drug therapy: Secondary | ICD-10-CM | POA: Diagnosis not present

## 2012-05-31 DIAGNOSIS — T8610 Unspecified complication of kidney transplant: Secondary | ICD-10-CM | POA: Diagnosis not present

## 2012-05-31 DIAGNOSIS — Z940 Kidney transplant status: Secondary | ICD-10-CM | POA: Diagnosis not present

## 2012-05-31 DIAGNOSIS — E119 Type 2 diabetes mellitus without complications: Secondary | ICD-10-CM | POA: Diagnosis not present

## 2012-05-31 DIAGNOSIS — B349 Viral infection, unspecified: Secondary | ICD-10-CM | POA: Diagnosis not present

## 2012-06-02 DIAGNOSIS — E872 Acidosis: Secondary | ICD-10-CM | POA: Diagnosis not present

## 2012-06-02 DIAGNOSIS — Z9483 Pancreas transplant status: Secondary | ICD-10-CM | POA: Diagnosis not present

## 2012-06-02 DIAGNOSIS — Z79899 Other long term (current) drug therapy: Secondary | ICD-10-CM | POA: Diagnosis not present

## 2012-06-02 DIAGNOSIS — T8610 Unspecified complication of kidney transplant: Secondary | ICD-10-CM | POA: Diagnosis not present

## 2012-06-03 DIAGNOSIS — Z940 Kidney transplant status: Secondary | ICD-10-CM | POA: Diagnosis not present

## 2012-06-03 DIAGNOSIS — Z79899 Other long term (current) drug therapy: Secondary | ICD-10-CM | POA: Diagnosis not present

## 2012-06-03 DIAGNOSIS — E871 Hypo-osmolality and hyponatremia: Secondary | ICD-10-CM | POA: Diagnosis not present

## 2012-06-03 DIAGNOSIS — Z9483 Pancreas transplant status: Secondary | ICD-10-CM | POA: Diagnosis not present

## 2012-06-03 DIAGNOSIS — E1029 Type 1 diabetes mellitus with other diabetic kidney complication: Secondary | ICD-10-CM | POA: Diagnosis not present

## 2012-06-03 DIAGNOSIS — E872 Acidosis: Secondary | ICD-10-CM | POA: Diagnosis not present

## 2012-06-03 DIAGNOSIS — T8610 Unspecified complication of kidney transplant: Secondary | ICD-10-CM | POA: Diagnosis not present

## 2012-06-06 DIAGNOSIS — T8610 Unspecified complication of kidney transplant: Secondary | ICD-10-CM | POA: Diagnosis not present

## 2012-06-07 DIAGNOSIS — T8610 Unspecified complication of kidney transplant: Secondary | ICD-10-CM | POA: Diagnosis not present

## 2012-06-08 DIAGNOSIS — E1149 Type 2 diabetes mellitus with other diabetic neurological complication: Secondary | ICD-10-CM | POA: Diagnosis not present

## 2012-06-08 DIAGNOSIS — T8610 Unspecified complication of kidney transplant: Secondary | ICD-10-CM | POA: Diagnosis not present

## 2012-06-08 DIAGNOSIS — Z9483 Pancreas transplant status: Secondary | ICD-10-CM | POA: Diagnosis not present

## 2012-06-08 DIAGNOSIS — E1142 Type 2 diabetes mellitus with diabetic polyneuropathy: Secondary | ICD-10-CM | POA: Diagnosis not present

## 2012-06-08 DIAGNOSIS — Z940 Kidney transplant status: Secondary | ICD-10-CM | POA: Diagnosis not present

## 2012-06-08 DIAGNOSIS — Z79899 Other long term (current) drug therapy: Secondary | ICD-10-CM | POA: Diagnosis not present

## 2012-06-10 DIAGNOSIS — T8610 Unspecified complication of kidney transplant: Secondary | ICD-10-CM | POA: Diagnosis not present

## 2012-06-13 DIAGNOSIS — T8610 Unspecified complication of kidney transplant: Secondary | ICD-10-CM | POA: Diagnosis not present

## 2012-06-15 DIAGNOSIS — T8610 Unspecified complication of kidney transplant: Secondary | ICD-10-CM | POA: Diagnosis not present

## 2012-06-15 DIAGNOSIS — Z940 Kidney transplant status: Secondary | ICD-10-CM | POA: Diagnosis not present

## 2012-06-15 DIAGNOSIS — Z79899 Other long term (current) drug therapy: Secondary | ICD-10-CM | POA: Diagnosis not present

## 2012-06-15 DIAGNOSIS — Z9483 Pancreas transplant status: Secondary | ICD-10-CM | POA: Diagnosis not present

## 2012-06-21 DIAGNOSIS — Z9483 Pancreas transplant status: Secondary | ICD-10-CM | POA: Diagnosis not present

## 2012-06-21 DIAGNOSIS — T8610 Unspecified complication of kidney transplant: Secondary | ICD-10-CM | POA: Diagnosis not present

## 2012-06-23 DIAGNOSIS — Z7982 Long term (current) use of aspirin: Secondary | ICD-10-CM | POA: Diagnosis not present

## 2012-06-23 DIAGNOSIS — Z9483 Pancreas transplant status: Secondary | ICD-10-CM | POA: Diagnosis not present

## 2012-06-23 DIAGNOSIS — Z940 Kidney transplant status: Secondary | ICD-10-CM | POA: Diagnosis not present

## 2012-06-23 DIAGNOSIS — Z794 Long term (current) use of insulin: Secondary | ICD-10-CM | POA: Diagnosis not present

## 2012-06-23 DIAGNOSIS — Z79899 Other long term (current) drug therapy: Secondary | ICD-10-CM | POA: Diagnosis not present

## 2012-06-23 DIAGNOSIS — T8610 Unspecified complication of kidney transplant: Secondary | ICD-10-CM | POA: Diagnosis not present

## 2012-06-23 DIAGNOSIS — D899 Disorder involving the immune mechanism, unspecified: Secondary | ICD-10-CM | POA: Diagnosis not present

## 2012-06-23 DIAGNOSIS — E119 Type 2 diabetes mellitus without complications: Secondary | ICD-10-CM | POA: Diagnosis not present

## 2012-06-24 DIAGNOSIS — T8610 Unspecified complication of kidney transplant: Secondary | ICD-10-CM | POA: Diagnosis not present

## 2012-06-28 DIAGNOSIS — T8610 Unspecified complication of kidney transplant: Secondary | ICD-10-CM | POA: Diagnosis not present

## 2012-06-29 DIAGNOSIS — T8610 Unspecified complication of kidney transplant: Secondary | ICD-10-CM | POA: Diagnosis not present

## 2012-07-01 DIAGNOSIS — Z9483 Pancreas transplant status: Secondary | ICD-10-CM | POA: Diagnosis not present

## 2012-07-01 DIAGNOSIS — T8610 Unspecified complication of kidney transplant: Secondary | ICD-10-CM | POA: Diagnosis not present

## 2012-07-01 DIAGNOSIS — Z79899 Other long term (current) drug therapy: Secondary | ICD-10-CM | POA: Diagnosis not present

## 2012-07-01 DIAGNOSIS — Z940 Kidney transplant status: Secondary | ICD-10-CM | POA: Diagnosis not present

## 2012-07-04 DIAGNOSIS — B349 Viral infection, unspecified: Secondary | ICD-10-CM | POA: Diagnosis not present

## 2012-07-04 DIAGNOSIS — Z940 Kidney transplant status: Secondary | ICD-10-CM | POA: Diagnosis not present

## 2012-07-04 DIAGNOSIS — Z9483 Pancreas transplant status: Secondary | ICD-10-CM | POA: Diagnosis not present

## 2012-07-04 DIAGNOSIS — T8610 Unspecified complication of kidney transplant: Secondary | ICD-10-CM | POA: Diagnosis not present

## 2012-07-04 DIAGNOSIS — I10 Essential (primary) hypertension: Secondary | ICD-10-CM | POA: Diagnosis not present

## 2012-07-04 DIAGNOSIS — E119 Type 2 diabetes mellitus without complications: Secondary | ICD-10-CM | POA: Diagnosis not present

## 2012-07-04 DIAGNOSIS — Z79899 Other long term (current) drug therapy: Secondary | ICD-10-CM | POA: Diagnosis not present

## 2012-07-04 DIAGNOSIS — Z48298 Encounter for aftercare following other organ transplant: Secondary | ICD-10-CM | POA: Diagnosis not present

## 2012-07-12 DIAGNOSIS — Z79899 Other long term (current) drug therapy: Secondary | ICD-10-CM | POA: Diagnosis not present

## 2012-07-12 DIAGNOSIS — I129 Hypertensive chronic kidney disease with stage 1 through stage 4 chronic kidney disease, or unspecified chronic kidney disease: Secondary | ICD-10-CM | POA: Diagnosis present

## 2012-07-12 DIAGNOSIS — N182 Chronic kidney disease, stage 2 (mild): Secondary | ICD-10-CM | POA: Diagnosis not present

## 2012-07-12 DIAGNOSIS — D631 Anemia in chronic kidney disease: Secondary | ICD-10-CM | POA: Diagnosis present

## 2012-07-12 DIAGNOSIS — E1039 Type 1 diabetes mellitus with other diabetic ophthalmic complication: Secondary | ICD-10-CM | POA: Diagnosis present

## 2012-07-12 DIAGNOSIS — Z940 Kidney transplant status: Secondary | ICD-10-CM | POA: Diagnosis not present

## 2012-07-12 DIAGNOSIS — E11319 Type 2 diabetes mellitus with unspecified diabetic retinopathy without macular edema: Secondary | ICD-10-CM | POA: Diagnosis present

## 2012-07-12 DIAGNOSIS — D649 Anemia, unspecified: Secondary | ICD-10-CM | POA: Diagnosis not present

## 2012-07-12 DIAGNOSIS — T8610 Unspecified complication of kidney transplant: Secondary | ICD-10-CM | POA: Diagnosis not present

## 2012-07-12 DIAGNOSIS — E785 Hyperlipidemia, unspecified: Secondary | ICD-10-CM | POA: Diagnosis present

## 2012-07-12 DIAGNOSIS — Z9483 Pancreas transplant status: Secondary | ICD-10-CM | POA: Diagnosis not present

## 2012-07-12 DIAGNOSIS — D899 Disorder involving the immune mechanism, unspecified: Secondary | ICD-10-CM | POA: Diagnosis not present

## 2012-07-15 DIAGNOSIS — T8610 Unspecified complication of kidney transplant: Secondary | ICD-10-CM | POA: Diagnosis not present

## 2012-07-22 DIAGNOSIS — Z940 Kidney transplant status: Secondary | ICD-10-CM | POA: Diagnosis not present

## 2012-07-22 DIAGNOSIS — B349 Viral infection, unspecified: Secondary | ICD-10-CM | POA: Diagnosis not present

## 2012-07-22 DIAGNOSIS — I10 Essential (primary) hypertension: Secondary | ICD-10-CM | POA: Diagnosis not present

## 2012-07-22 DIAGNOSIS — N186 End stage renal disease: Secondary | ICD-10-CM | POA: Diagnosis not present

## 2012-07-22 DIAGNOSIS — Z9483 Pancreas transplant status: Secondary | ICD-10-CM | POA: Diagnosis not present

## 2012-07-22 DIAGNOSIS — E1029 Type 1 diabetes mellitus with other diabetic kidney complication: Secondary | ICD-10-CM | POA: Diagnosis not present

## 2012-07-22 DIAGNOSIS — D899 Disorder involving the immune mechanism, unspecified: Secondary | ICD-10-CM | POA: Diagnosis not present

## 2012-07-22 DIAGNOSIS — Z79899 Other long term (current) drug therapy: Secondary | ICD-10-CM | POA: Diagnosis not present

## 2012-07-22 DIAGNOSIS — T8610 Unspecified complication of kidney transplant: Secondary | ICD-10-CM | POA: Diagnosis not present

## 2012-07-22 DIAGNOSIS — I120 Hypertensive chronic kidney disease with stage 5 chronic kidney disease or end stage renal disease: Secondary | ICD-10-CM | POA: Diagnosis not present

## 2012-07-22 DIAGNOSIS — R635 Abnormal weight gain: Secondary | ICD-10-CM | POA: Diagnosis not present

## 2012-07-26 DIAGNOSIS — T8610 Unspecified complication of kidney transplant: Secondary | ICD-10-CM | POA: Diagnosis not present

## 2012-07-29 DIAGNOSIS — Z940 Kidney transplant status: Secondary | ICD-10-CM | POA: Diagnosis not present

## 2012-07-29 DIAGNOSIS — T8610 Unspecified complication of kidney transplant: Secondary | ICD-10-CM | POA: Diagnosis not present

## 2012-07-29 DIAGNOSIS — Z438 Encounter for attention to other artificial openings: Secondary | ICD-10-CM | POA: Diagnosis not present

## 2012-08-01 ENCOUNTER — Ambulatory Visit (INDEPENDENT_AMBULATORY_CARE_PROVIDER_SITE_OTHER): Payer: BC Managed Care – PPO | Admitting: Ophthalmology

## 2012-08-03 DIAGNOSIS — R635 Abnormal weight gain: Secondary | ICD-10-CM | POA: Diagnosis not present

## 2012-08-03 DIAGNOSIS — E872 Acidosis: Secondary | ICD-10-CM | POA: Diagnosis not present

## 2012-08-03 DIAGNOSIS — Z940 Kidney transplant status: Secondary | ICD-10-CM | POA: Diagnosis not present

## 2012-08-03 DIAGNOSIS — E1142 Type 2 diabetes mellitus with diabetic polyneuropathy: Secondary | ICD-10-CM | POA: Diagnosis not present

## 2012-08-03 DIAGNOSIS — Z79899 Other long term (current) drug therapy: Secondary | ICD-10-CM | POA: Diagnosis not present

## 2012-08-03 DIAGNOSIS — N259 Disorder resulting from impaired renal tubular function, unspecified: Secondary | ICD-10-CM | POA: Diagnosis not present

## 2012-08-03 DIAGNOSIS — IMO0002 Reserved for concepts with insufficient information to code with codable children: Secondary | ICD-10-CM | POA: Diagnosis not present

## 2012-08-03 DIAGNOSIS — E11319 Type 2 diabetes mellitus with unspecified diabetic retinopathy without macular edema: Secondary | ICD-10-CM | POA: Diagnosis not present

## 2012-08-03 DIAGNOSIS — Z794 Long term (current) use of insulin: Secondary | ICD-10-CM | POA: Diagnosis not present

## 2012-08-03 DIAGNOSIS — D899 Disorder involving the immune mechanism, unspecified: Secondary | ICD-10-CM | POA: Diagnosis not present

## 2012-08-03 DIAGNOSIS — K3184 Gastroparesis: Secondary | ICD-10-CM | POA: Diagnosis not present

## 2012-08-03 DIAGNOSIS — Z9483 Pancreas transplant status: Secondary | ICD-10-CM | POA: Diagnosis not present

## 2012-08-03 DIAGNOSIS — E1039 Type 1 diabetes mellitus with other diabetic ophthalmic complication: Secondary | ICD-10-CM | POA: Diagnosis not present

## 2012-08-03 DIAGNOSIS — Z48298 Encounter for aftercare following other organ transplant: Secondary | ICD-10-CM | POA: Diagnosis not present

## 2012-08-03 DIAGNOSIS — Z8619 Personal history of other infectious and parasitic diseases: Secondary | ICD-10-CM | POA: Diagnosis not present

## 2012-08-03 DIAGNOSIS — Z7982 Long term (current) use of aspirin: Secondary | ICD-10-CM | POA: Diagnosis not present

## 2012-08-03 DIAGNOSIS — E1049 Type 1 diabetes mellitus with other diabetic neurological complication: Secondary | ICD-10-CM | POA: Diagnosis not present

## 2012-08-10 DIAGNOSIS — Z940 Kidney transplant status: Secondary | ICD-10-CM | POA: Diagnosis not present

## 2012-08-10 DIAGNOSIS — Z9483 Pancreas transplant status: Secondary | ICD-10-CM | POA: Diagnosis not present

## 2012-08-10 DIAGNOSIS — Z48298 Encounter for aftercare following other organ transplant: Secondary | ICD-10-CM | POA: Diagnosis not present

## 2012-08-24 DIAGNOSIS — Z48298 Encounter for aftercare following other organ transplant: Secondary | ICD-10-CM | POA: Diagnosis not present

## 2012-08-24 DIAGNOSIS — R5381 Other malaise: Secondary | ICD-10-CM | POA: Diagnosis not present

## 2012-08-24 DIAGNOSIS — Z794 Long term (current) use of insulin: Secondary | ICD-10-CM | POA: Diagnosis not present

## 2012-08-24 DIAGNOSIS — E1142 Type 2 diabetes mellitus with diabetic polyneuropathy: Secondary | ICD-10-CM | POA: Diagnosis not present

## 2012-08-24 DIAGNOSIS — Z7982 Long term (current) use of aspirin: Secondary | ICD-10-CM | POA: Diagnosis not present

## 2012-08-24 DIAGNOSIS — I10 Essential (primary) hypertension: Secondary | ICD-10-CM | POA: Diagnosis not present

## 2012-08-24 DIAGNOSIS — K3184 Gastroparesis: Secondary | ICD-10-CM | POA: Diagnosis not present

## 2012-08-24 DIAGNOSIS — E872 Acidosis: Secondary | ICD-10-CM | POA: Diagnosis not present

## 2012-08-24 DIAGNOSIS — E11319 Type 2 diabetes mellitus with unspecified diabetic retinopathy without macular edema: Secondary | ICD-10-CM | POA: Diagnosis not present

## 2012-08-24 DIAGNOSIS — Z79899 Other long term (current) drug therapy: Secondary | ICD-10-CM | POA: Diagnosis not present

## 2012-08-24 DIAGNOSIS — E1049 Type 1 diabetes mellitus with other diabetic neurological complication: Secondary | ICD-10-CM | POA: Diagnosis not present

## 2012-08-24 DIAGNOSIS — N2589 Other disorders resulting from impaired renal tubular function: Secondary | ICD-10-CM | POA: Diagnosis not present

## 2012-08-24 DIAGNOSIS — Z9483 Pancreas transplant status: Secondary | ICD-10-CM | POA: Diagnosis not present

## 2012-08-24 DIAGNOSIS — D899 Disorder involving the immune mechanism, unspecified: Secondary | ICD-10-CM | POA: Diagnosis not present

## 2012-08-24 DIAGNOSIS — R030 Elevated blood-pressure reading, without diagnosis of hypertension: Secondary | ICD-10-CM | POA: Diagnosis not present

## 2012-08-24 DIAGNOSIS — E1039 Type 1 diabetes mellitus with other diabetic ophthalmic complication: Secondary | ICD-10-CM | POA: Diagnosis not present

## 2012-08-24 DIAGNOSIS — Z940 Kidney transplant status: Secondary | ICD-10-CM | POA: Diagnosis not present

## 2012-08-31 DIAGNOSIS — Z940 Kidney transplant status: Secondary | ICD-10-CM | POA: Diagnosis not present

## 2012-08-31 DIAGNOSIS — Z48298 Encounter for aftercare following other organ transplant: Secondary | ICD-10-CM | POA: Diagnosis not present

## 2012-09-07 DIAGNOSIS — Z792 Long term (current) use of antibiotics: Secondary | ICD-10-CM | POA: Diagnosis not present

## 2012-09-07 DIAGNOSIS — Z7982 Long term (current) use of aspirin: Secondary | ICD-10-CM | POA: Diagnosis not present

## 2012-09-07 DIAGNOSIS — I10 Essential (primary) hypertension: Secondary | ICD-10-CM | POA: Diagnosis not present

## 2012-09-07 DIAGNOSIS — Z48298 Encounter for aftercare following other organ transplant: Secondary | ICD-10-CM | POA: Diagnosis not present

## 2012-09-07 DIAGNOSIS — K3184 Gastroparesis: Secondary | ICD-10-CM | POA: Diagnosis not present

## 2012-09-07 DIAGNOSIS — Z940 Kidney transplant status: Secondary | ICD-10-CM | POA: Diagnosis not present

## 2012-09-07 DIAGNOSIS — D899 Disorder involving the immune mechanism, unspecified: Secondary | ICD-10-CM | POA: Diagnosis not present

## 2012-09-07 DIAGNOSIS — Z79899 Other long term (current) drug therapy: Secondary | ICD-10-CM | POA: Diagnosis not present

## 2012-09-07 DIAGNOSIS — N2589 Other disorders resulting from impaired renal tubular function: Secondary | ICD-10-CM | POA: Diagnosis not present

## 2012-09-07 DIAGNOSIS — E1049 Type 1 diabetes mellitus with other diabetic neurological complication: Secondary | ICD-10-CM | POA: Diagnosis not present

## 2012-09-07 DIAGNOSIS — IMO0002 Reserved for concepts with insufficient information to code with codable children: Secondary | ICD-10-CM | POA: Diagnosis not present

## 2012-09-07 DIAGNOSIS — Z8619 Personal history of other infectious and parasitic diseases: Secondary | ICD-10-CM | POA: Diagnosis not present

## 2012-09-07 DIAGNOSIS — Z9483 Pancreas transplant status: Secondary | ICD-10-CM | POA: Diagnosis not present

## 2012-09-08 DIAGNOSIS — E860 Dehydration: Secondary | ICD-10-CM | POA: Diagnosis not present

## 2012-09-08 DIAGNOSIS — I10 Essential (primary) hypertension: Secondary | ICD-10-CM | POA: Diagnosis not present

## 2012-09-08 DIAGNOSIS — E785 Hyperlipidemia, unspecified: Secondary | ICD-10-CM | POA: Diagnosis not present

## 2012-09-08 DIAGNOSIS — Z940 Kidney transplant status: Secondary | ICD-10-CM | POA: Diagnosis not present

## 2012-09-08 DIAGNOSIS — Z79899 Other long term (current) drug therapy: Secondary | ICD-10-CM | POA: Diagnosis not present

## 2012-09-08 DIAGNOSIS — T8610 Unspecified complication of kidney transplant: Secondary | ICD-10-CM | POA: Diagnosis not present

## 2012-09-08 DIAGNOSIS — K219 Gastro-esophageal reflux disease without esophagitis: Secondary | ICD-10-CM | POA: Diagnosis not present

## 2012-09-08 DIAGNOSIS — Z9483 Pancreas transplant status: Secondary | ICD-10-CM | POA: Diagnosis not present

## 2012-09-08 DIAGNOSIS — N179 Acute kidney failure, unspecified: Secondary | ICD-10-CM | POA: Insufficient documentation

## 2012-09-21 DIAGNOSIS — N179 Acute kidney failure, unspecified: Secondary | ICD-10-CM | POA: Diagnosis not present

## 2012-09-21 DIAGNOSIS — IMO0002 Reserved for concepts with insufficient information to code with codable children: Secondary | ICD-10-CM | POA: Diagnosis not present

## 2012-09-21 DIAGNOSIS — Z9483 Pancreas transplant status: Secondary | ICD-10-CM | POA: Diagnosis not present

## 2012-09-21 DIAGNOSIS — E1039 Type 1 diabetes mellitus with other diabetic ophthalmic complication: Secondary | ICD-10-CM | POA: Diagnosis not present

## 2012-09-21 DIAGNOSIS — Z792 Long term (current) use of antibiotics: Secondary | ICD-10-CM | POA: Diagnosis not present

## 2012-09-21 DIAGNOSIS — Z48298 Encounter for aftercare following other organ transplant: Secondary | ICD-10-CM | POA: Diagnosis not present

## 2012-09-21 DIAGNOSIS — K3184 Gastroparesis: Secondary | ICD-10-CM | POA: Diagnosis not present

## 2012-09-21 DIAGNOSIS — B349 Viral infection, unspecified: Secondary | ICD-10-CM | POA: Diagnosis not present

## 2012-09-21 DIAGNOSIS — D899 Disorder involving the immune mechanism, unspecified: Secondary | ICD-10-CM | POA: Diagnosis not present

## 2012-09-21 DIAGNOSIS — Z79899 Other long term (current) drug therapy: Secondary | ICD-10-CM | POA: Diagnosis not present

## 2012-09-21 DIAGNOSIS — E11319 Type 2 diabetes mellitus with unspecified diabetic retinopathy without macular edema: Secondary | ICD-10-CM | POA: Diagnosis not present

## 2012-09-21 DIAGNOSIS — Z8619 Personal history of other infectious and parasitic diseases: Secondary | ICD-10-CM | POA: Diagnosis not present

## 2012-09-21 DIAGNOSIS — I10 Essential (primary) hypertension: Secondary | ICD-10-CM | POA: Diagnosis not present

## 2012-09-21 DIAGNOSIS — Z7982 Long term (current) use of aspirin: Secondary | ICD-10-CM | POA: Diagnosis not present

## 2012-09-21 DIAGNOSIS — Z940 Kidney transplant status: Secondary | ICD-10-CM | POA: Diagnosis not present

## 2012-10-12 DIAGNOSIS — Z7982 Long term (current) use of aspirin: Secondary | ICD-10-CM | POA: Diagnosis not present

## 2012-10-12 DIAGNOSIS — E11319 Type 2 diabetes mellitus with unspecified diabetic retinopathy without macular edema: Secondary | ICD-10-CM | POA: Diagnosis not present

## 2012-10-12 DIAGNOSIS — Z9483 Pancreas transplant status: Secondary | ICD-10-CM | POA: Diagnosis not present

## 2012-10-12 DIAGNOSIS — Z794 Long term (current) use of insulin: Secondary | ICD-10-CM | POA: Diagnosis not present

## 2012-10-12 DIAGNOSIS — E1142 Type 2 diabetes mellitus with diabetic polyneuropathy: Secondary | ICD-10-CM | POA: Diagnosis not present

## 2012-10-12 DIAGNOSIS — Z8619 Personal history of other infectious and parasitic diseases: Secondary | ICD-10-CM | POA: Diagnosis not present

## 2012-10-12 DIAGNOSIS — E1149 Type 2 diabetes mellitus with other diabetic neurological complication: Secondary | ICD-10-CM | POA: Diagnosis not present

## 2012-10-12 DIAGNOSIS — Z792 Long term (current) use of antibiotics: Secondary | ICD-10-CM | POA: Diagnosis not present

## 2012-10-12 DIAGNOSIS — K3184 Gastroparesis: Secondary | ICD-10-CM | POA: Diagnosis not present

## 2012-10-12 DIAGNOSIS — E1139 Type 2 diabetes mellitus with other diabetic ophthalmic complication: Secondary | ICD-10-CM | POA: Diagnosis not present

## 2012-10-12 DIAGNOSIS — Z48298 Encounter for aftercare following other organ transplant: Secondary | ICD-10-CM | POA: Diagnosis not present

## 2012-10-12 DIAGNOSIS — R030 Elevated blood-pressure reading, without diagnosis of hypertension: Secondary | ICD-10-CM | POA: Diagnosis not present

## 2012-10-12 DIAGNOSIS — D899 Disorder involving the immune mechanism, unspecified: Secondary | ICD-10-CM | POA: Diagnosis not present

## 2012-10-12 DIAGNOSIS — Z79899 Other long term (current) drug therapy: Secondary | ICD-10-CM | POA: Diagnosis not present

## 2012-10-12 DIAGNOSIS — IMO0002 Reserved for concepts with insufficient information to code with codable children: Secondary | ICD-10-CM | POA: Diagnosis not present

## 2012-10-12 DIAGNOSIS — Z940 Kidney transplant status: Secondary | ICD-10-CM | POA: Diagnosis not present

## 2012-10-12 DIAGNOSIS — E872 Acidosis: Secondary | ICD-10-CM | POA: Diagnosis not present

## 2012-12-30 DIAGNOSIS — R635 Abnormal weight gain: Secondary | ICD-10-CM | POA: Diagnosis not present

## 2012-12-30 DIAGNOSIS — G589 Mononeuropathy, unspecified: Secondary | ICD-10-CM | POA: Diagnosis not present

## 2012-12-30 DIAGNOSIS — E1039 Type 1 diabetes mellitus with other diabetic ophthalmic complication: Secondary | ICD-10-CM | POA: Diagnosis not present

## 2012-12-30 DIAGNOSIS — Z79899 Other long term (current) drug therapy: Secondary | ICD-10-CM | POA: Diagnosis not present

## 2012-12-30 DIAGNOSIS — E11319 Type 2 diabetes mellitus with unspecified diabetic retinopathy without macular edema: Secondary | ICD-10-CM | POA: Diagnosis not present

## 2012-12-30 DIAGNOSIS — Z48298 Encounter for aftercare following other organ transplant: Secondary | ICD-10-CM | POA: Diagnosis not present

## 2012-12-30 DIAGNOSIS — E872 Acidosis: Secondary | ICD-10-CM | POA: Diagnosis not present

## 2012-12-30 DIAGNOSIS — K3184 Gastroparesis: Secondary | ICD-10-CM | POA: Diagnosis not present

## 2012-12-30 DIAGNOSIS — I10 Essential (primary) hypertension: Secondary | ICD-10-CM | POA: Diagnosis not present

## 2012-12-30 DIAGNOSIS — IMO0002 Reserved for concepts with insufficient information to code with codable children: Secondary | ICD-10-CM | POA: Diagnosis not present

## 2012-12-30 DIAGNOSIS — D899 Disorder involving the immune mechanism, unspecified: Secondary | ICD-10-CM | POA: Diagnosis not present

## 2012-12-30 DIAGNOSIS — Z940 Kidney transplant status: Secondary | ICD-10-CM | POA: Diagnosis not present

## 2012-12-30 DIAGNOSIS — N2589 Other disorders resulting from impaired renal tubular function: Secondary | ICD-10-CM | POA: Diagnosis not present

## 2012-12-30 DIAGNOSIS — Z792 Long term (current) use of antibiotics: Secondary | ICD-10-CM | POA: Diagnosis not present

## 2012-12-30 DIAGNOSIS — Z7982 Long term (current) use of aspirin: Secondary | ICD-10-CM | POA: Diagnosis not present

## 2012-12-30 DIAGNOSIS — Z9483 Pancreas transplant status: Secondary | ICD-10-CM | POA: Diagnosis not present

## 2012-12-30 DIAGNOSIS — B9789 Other viral agents as the cause of diseases classified elsewhere: Secondary | ICD-10-CM | POA: Diagnosis not present

## 2013-01-16 DIAGNOSIS — Z9483 Pancreas transplant status: Secondary | ICD-10-CM | POA: Diagnosis not present

## 2013-01-16 DIAGNOSIS — T8610 Unspecified complication of kidney transplant: Secondary | ICD-10-CM | POA: Diagnosis not present

## 2013-01-16 DIAGNOSIS — E872 Acidosis: Secondary | ICD-10-CM | POA: Diagnosis not present

## 2013-01-16 DIAGNOSIS — E1139 Type 2 diabetes mellitus with other diabetic ophthalmic complication: Secondary | ICD-10-CM | POA: Diagnosis not present

## 2013-01-16 DIAGNOSIS — I10 Essential (primary) hypertension: Secondary | ICD-10-CM | POA: Diagnosis not present

## 2013-01-16 DIAGNOSIS — E11319 Type 2 diabetes mellitus with unspecified diabetic retinopathy without macular edema: Secondary | ICD-10-CM | POA: Diagnosis not present

## 2013-01-16 DIAGNOSIS — Z79899 Other long term (current) drug therapy: Secondary | ICD-10-CM | POA: Diagnosis not present

## 2013-01-16 DIAGNOSIS — K3184 Gastroparesis: Secondary | ICD-10-CM | POA: Diagnosis not present

## 2013-01-16 DIAGNOSIS — E782 Mixed hyperlipidemia: Secondary | ICD-10-CM | POA: Diagnosis not present

## 2013-01-19 DIAGNOSIS — D631 Anemia in chronic kidney disease: Secondary | ICD-10-CM | POA: Diagnosis not present

## 2013-01-19 DIAGNOSIS — N2581 Secondary hyperparathyroidism of renal origin: Secondary | ICD-10-CM | POA: Diagnosis not present

## 2013-01-19 DIAGNOSIS — N185 Chronic kidney disease, stage 5: Secondary | ICD-10-CM | POA: Diagnosis not present

## 2013-01-30 DIAGNOSIS — D649 Anemia, unspecified: Secondary | ICD-10-CM | POA: Diagnosis not present

## 2013-01-30 DIAGNOSIS — N2581 Secondary hyperparathyroidism of renal origin: Secondary | ICD-10-CM | POA: Diagnosis not present

## 2013-01-30 DIAGNOSIS — Z940 Kidney transplant status: Secondary | ICD-10-CM | POA: Diagnosis not present

## 2013-01-30 DIAGNOSIS — E785 Hyperlipidemia, unspecified: Secondary | ICD-10-CM | POA: Diagnosis not present

## 2013-02-12 DIAGNOSIS — Z79899 Other long term (current) drug therapy: Secondary | ICD-10-CM | POA: Diagnosis not present

## 2013-02-12 DIAGNOSIS — D649 Anemia, unspecified: Secondary | ICD-10-CM | POA: Diagnosis not present

## 2013-02-12 DIAGNOSIS — N2581 Secondary hyperparathyroidism of renal origin: Secondary | ICD-10-CM | POA: Diagnosis not present

## 2013-02-12 DIAGNOSIS — Z940 Kidney transplant status: Secondary | ICD-10-CM | POA: Diagnosis not present

## 2013-02-27 DIAGNOSIS — D649 Anemia, unspecified: Secondary | ICD-10-CM | POA: Diagnosis not present

## 2013-02-27 DIAGNOSIS — Z79899 Other long term (current) drug therapy: Secondary | ICD-10-CM | POA: Diagnosis not present

## 2013-02-27 DIAGNOSIS — Z940 Kidney transplant status: Secondary | ICD-10-CM | POA: Diagnosis not present

## 2013-03-13 DIAGNOSIS — D649 Anemia, unspecified: Secondary | ICD-10-CM | POA: Diagnosis not present

## 2013-03-13 DIAGNOSIS — Z79899 Other long term (current) drug therapy: Secondary | ICD-10-CM | POA: Diagnosis not present

## 2013-03-13 DIAGNOSIS — Z940 Kidney transplant status: Secondary | ICD-10-CM | POA: Diagnosis not present

## 2013-04-10 DIAGNOSIS — D649 Anemia, unspecified: Secondary | ICD-10-CM | POA: Diagnosis not present

## 2013-04-10 DIAGNOSIS — Z79899 Other long term (current) drug therapy: Secondary | ICD-10-CM | POA: Diagnosis not present

## 2013-04-10 DIAGNOSIS — Z940 Kidney transplant status: Secondary | ICD-10-CM | POA: Diagnosis not present

## 2013-05-10 DIAGNOSIS — N039 Chronic nephritic syndrome with unspecified morphologic changes: Secondary | ICD-10-CM | POA: Diagnosis not present

## 2013-05-10 DIAGNOSIS — E785 Hyperlipidemia, unspecified: Secondary | ICD-10-CM | POA: Diagnosis not present

## 2013-05-10 DIAGNOSIS — Z940 Kidney transplant status: Secondary | ICD-10-CM | POA: Diagnosis not present

## 2013-05-10 DIAGNOSIS — D631 Anemia in chronic kidney disease: Secondary | ICD-10-CM | POA: Diagnosis not present

## 2013-05-10 DIAGNOSIS — N185 Chronic kidney disease, stage 5: Secondary | ICD-10-CM | POA: Diagnosis not present

## 2013-05-10 DIAGNOSIS — N2581 Secondary hyperparathyroidism of renal origin: Secondary | ICD-10-CM | POA: Diagnosis not present

## 2013-05-10 DIAGNOSIS — R809 Proteinuria, unspecified: Secondary | ICD-10-CM | POA: Diagnosis not present

## 2013-05-22 DIAGNOSIS — N2581 Secondary hyperparathyroidism of renal origin: Secondary | ICD-10-CM | POA: Diagnosis not present

## 2013-05-22 DIAGNOSIS — N189 Chronic kidney disease, unspecified: Secondary | ICD-10-CM | POA: Diagnosis not present

## 2013-05-22 DIAGNOSIS — Z940 Kidney transplant status: Secondary | ICD-10-CM | POA: Diagnosis not present

## 2013-05-22 DIAGNOSIS — D631 Anemia in chronic kidney disease: Secondary | ICD-10-CM | POA: Diagnosis not present

## 2013-05-22 DIAGNOSIS — E109 Type 1 diabetes mellitus without complications: Secondary | ICD-10-CM | POA: Diagnosis not present

## 2013-06-09 ENCOUNTER — Ambulatory Visit (INDEPENDENT_AMBULATORY_CARE_PROVIDER_SITE_OTHER): Payer: Self-pay | Admitting: Ophthalmology

## 2013-06-10 DIAGNOSIS — W458XXA Other foreign body or object entering through skin, initial encounter: Secondary | ICD-10-CM | POA: Diagnosis not present

## 2013-06-26 ENCOUNTER — Ambulatory Visit (INDEPENDENT_AMBULATORY_CARE_PROVIDER_SITE_OTHER): Payer: Medicare Other | Admitting: Ophthalmology

## 2013-06-26 DIAGNOSIS — E1139 Type 2 diabetes mellitus with other diabetic ophthalmic complication: Secondary | ICD-10-CM

## 2013-06-26 DIAGNOSIS — E11359 Type 2 diabetes mellitus with proliferative diabetic retinopathy without macular edema: Secondary | ICD-10-CM | POA: Diagnosis not present

## 2013-06-26 DIAGNOSIS — H35039 Hypertensive retinopathy, unspecified eye: Secondary | ICD-10-CM | POA: Diagnosis not present

## 2013-06-26 DIAGNOSIS — H2510 Age-related nuclear cataract, unspecified eye: Secondary | ICD-10-CM

## 2013-06-26 DIAGNOSIS — I10 Essential (primary) hypertension: Secondary | ICD-10-CM

## 2013-06-26 DIAGNOSIS — E1165 Type 2 diabetes mellitus with hyperglycemia: Secondary | ICD-10-CM

## 2013-06-28 DIAGNOSIS — I10 Essential (primary) hypertension: Secondary | ICD-10-CM | POA: Diagnosis not present

## 2013-06-28 DIAGNOSIS — E872 Acidosis, unspecified: Secondary | ICD-10-CM | POA: Diagnosis not present

## 2013-06-28 DIAGNOSIS — R635 Abnormal weight gain: Secondary | ICD-10-CM | POA: Diagnosis not present

## 2013-06-28 DIAGNOSIS — Z5181 Encounter for therapeutic drug level monitoring: Secondary | ICD-10-CM | POA: Diagnosis not present

## 2013-06-28 DIAGNOSIS — Z940 Kidney transplant status: Secondary | ICD-10-CM | POA: Diagnosis not present

## 2013-06-28 DIAGNOSIS — Z48298 Encounter for aftercare following other organ transplant: Secondary | ICD-10-CM | POA: Diagnosis not present

## 2013-06-28 DIAGNOSIS — Z9483 Pancreas transplant status: Secondary | ICD-10-CM | POA: Diagnosis not present

## 2013-06-28 DIAGNOSIS — D899 Disorder involving the immune mechanism, unspecified: Secondary | ICD-10-CM | POA: Diagnosis not present

## 2013-06-28 DIAGNOSIS — Z7982 Long term (current) use of aspirin: Secondary | ICD-10-CM | POA: Diagnosis not present

## 2013-06-29 ENCOUNTER — Ambulatory Visit: Payer: BC Managed Care – PPO | Admitting: Dietician

## 2013-07-05 DIAGNOSIS — Z940 Kidney transplant status: Secondary | ICD-10-CM | POA: Diagnosis not present

## 2013-07-05 DIAGNOSIS — Z48298 Encounter for aftercare following other organ transplant: Secondary | ICD-10-CM | POA: Diagnosis not present

## 2013-07-05 DIAGNOSIS — Z9483 Pancreas transplant status: Secondary | ICD-10-CM | POA: Diagnosis not present

## 2013-09-28 DIAGNOSIS — N039 Chronic nephritic syndrome with unspecified morphologic changes: Secondary | ICD-10-CM | POA: Diagnosis not present

## 2013-09-28 DIAGNOSIS — N2581 Secondary hyperparathyroidism of renal origin: Secondary | ICD-10-CM | POA: Diagnosis not present

## 2013-09-28 DIAGNOSIS — N185 Chronic kidney disease, stage 5: Secondary | ICD-10-CM | POA: Diagnosis not present

## 2013-09-28 DIAGNOSIS — D631 Anemia in chronic kidney disease: Secondary | ICD-10-CM | POA: Diagnosis not present

## 2013-09-28 DIAGNOSIS — E1129 Type 2 diabetes mellitus with other diabetic kidney complication: Secondary | ICD-10-CM | POA: Diagnosis not present

## 2013-09-28 DIAGNOSIS — R809 Proteinuria, unspecified: Secondary | ICD-10-CM | POA: Diagnosis not present

## 2013-09-28 DIAGNOSIS — G609 Hereditary and idiopathic neuropathy, unspecified: Secondary | ICD-10-CM | POA: Diagnosis not present

## 2013-10-10 DIAGNOSIS — M216X9 Other acquired deformities of unspecified foot: Secondary | ICD-10-CM | POA: Diagnosis not present

## 2013-10-10 DIAGNOSIS — M722 Plantar fascial fibromatosis: Secondary | ICD-10-CM | POA: Diagnosis not present

## 2013-11-06 DIAGNOSIS — Z940 Kidney transplant status: Secondary | ICD-10-CM | POA: Diagnosis not present

## 2013-11-06 DIAGNOSIS — E1129 Type 2 diabetes mellitus with other diabetic kidney complication: Secondary | ICD-10-CM | POA: Diagnosis not present

## 2013-11-06 DIAGNOSIS — Z9483 Pancreas transplant status: Secondary | ICD-10-CM | POA: Diagnosis not present

## 2013-11-07 DIAGNOSIS — G5750 Tarsal tunnel syndrome, unspecified lower limb: Secondary | ICD-10-CM | POA: Diagnosis not present

## 2013-11-07 DIAGNOSIS — M7750 Other enthesopathy of unspecified foot: Secondary | ICD-10-CM | POA: Diagnosis not present

## 2013-11-28 DIAGNOSIS — M7750 Other enthesopathy of unspecified foot: Secondary | ICD-10-CM | POA: Diagnosis not present

## 2013-12-06 ENCOUNTER — Ambulatory Visit (INDEPENDENT_AMBULATORY_CARE_PROVIDER_SITE_OTHER): Payer: Medicare Other

## 2013-12-06 ENCOUNTER — Encounter: Payer: Self-pay | Admitting: Podiatry

## 2013-12-06 ENCOUNTER — Ambulatory Visit (INDEPENDENT_AMBULATORY_CARE_PROVIDER_SITE_OTHER): Payer: Medicare Other | Admitting: Podiatry

## 2013-12-06 VITALS — BP 173/92 | HR 86 | Ht 70.0 in | Wt 240.0 lb

## 2013-12-06 DIAGNOSIS — R609 Edema, unspecified: Secondary | ICD-10-CM

## 2013-12-06 DIAGNOSIS — S92251A Displaced fracture of navicular [scaphoid] of right foot, initial encounter for closed fracture: Secondary | ICD-10-CM

## 2013-12-06 DIAGNOSIS — S92253A Displaced fracture of navicular [scaphoid] of unspecified foot, initial encounter for closed fracture: Secondary | ICD-10-CM

## 2013-12-06 NOTE — Progress Notes (Signed)
   Subjective:    Patient ID: Derek Johnston, male    DOB: 09/20/1980, 33 y.o.   MRN: [id number]  HPI Comments: N foot pain L right dorsal foot and anterior ankle D last May 2015 O pt ran over his right foot with lumber cart C swelling and aching pain  A pain with ambulation/weightbearing T Dr. Adam Phenixody [facility] gave injections x 3, pt states Creatnine, Phosphate checked within normal limits  Foot Pain Associated symptoms include joint swelling.      Review of Systems  Musculoskeletal: Positive for gait problem and joint swelling.  All other systems reviewed and are negative.      Objective:   Physical Exam  Constitutional: He is oriented to person, place, and time. He appears well-developed and well-nourished.  Musculoskeletal: He exhibits edema.  Edema to the ankle. Tenderness to palpation overlying the navicular tuberosity as well as in the anterior aspect of the ankle mostly over the ATFL, as well as the medial aspect of the ankle. Mild pain with inversion is mostly located over the medial aspect of the ankle and navicular tuberosity.  Neurological: He is alert and oriented to person, place, and time.  Protective sensation intact.  Skin: Skin is warm.  No open lesions, erythema.          Assessment & Plan:  A 33 year old male with pain over the medial aspect of the ankle as well as the navicular tuberosity.  -X-rays were obtained of both the foot and the ankle on the right which reveal possible old fracture of the navicular as well as slightly sclerotic appearance of the navicular. -Given x-ray and clinical findings and mobilization was recommended at this time. The patient is placed into a CAM boot. -Limit weightbearing to as tolerated -Followup in one month or sooner if any problems are to arise. If no improvement in symptoms will consider advanced imaging of his foot.  -Recommend PCP f/u for BP

## 2013-12-08 ENCOUNTER — Telehealth: Payer: Self-pay | Admitting: Podiatry

## 2013-12-08 ENCOUNTER — Telehealth: Payer: Self-pay | Admitting: *Deleted

## 2013-12-08 DIAGNOSIS — S92251A Displaced fracture of navicular [scaphoid] of right foot, initial encounter for closed fracture: Secondary | ICD-10-CM

## 2013-12-08 NOTE — Telephone Encounter (Signed)
Message copied by Enedina FinnerMEADOWS, Noeli Lavery J on Fri Dec 08, 2013  1:33 PM ------      Message from: Marylou MccoyQUINTANA, JESSICA L      Created: Fri Dec 08, 2013  8:14 AM      Regarding: FW: CT Scan                   ----- Message -----         From: Ovid CurdMatthew Wagoner, DPM         Sent: 12/07/2013   8:52 PM           To: Marylou MccoyJessica L Quintana, RN      Subject: CT Scan                                                  Could you, or whoever sets them up, get a CT of the Right foot/ankle without contrast scheduled for this patient? Dx AVN Navicular.             Thanks.  ------

## 2013-12-08 NOTE — Telephone Encounter (Signed)
Order placed for CT Scan Right foot and ankle.  [facility] [address]

## 2013-12-08 NOTE — Telephone Encounter (Signed)
Called patient to follow with him. He states he is feeling "fine". Stated his edema has decreased this morning, but after being on his feet the edema returns. Discussed with him after reviewing the x-rays again, I would like to proceed with getting a CT scan of the foot/ankle to evaluate the navicular closer. Discussed fracture/AVN/Charcot. I recommended that he remain in his CAM boot and to try to keep off the foot as much as possible. He states that he has to work and he is on his feet. I told him to limit it as much as possible to help prevent future complications/worsening. He will follow up after the results of the CT are obtained. Directed to call if any questions/concerns/problems arise.

## 2013-12-14 ENCOUNTER — Ambulatory Visit
Admission: RE | Admit: 2013-12-14 | Discharge: 2013-12-14 | Disposition: A | Discharge status: Home / Self Care | Payer: Medicare Other | Source: Ambulatory Visit | Attending: Podiatry | Admitting: Podiatry

## 2013-12-14 DIAGNOSIS — M25579 Pain in unspecified ankle and joints of unspecified foot: Secondary | ICD-10-CM | POA: Diagnosis not present

## 2013-12-14 DIAGNOSIS — S92251A Displaced fracture of navicular [scaphoid] of right foot, initial encounter for closed fracture: Secondary | ICD-10-CM

## 2013-12-14 DIAGNOSIS — M7989 Other specified soft tissue disorders: Secondary | ICD-10-CM | POA: Diagnosis not present

## 2013-12-14 DIAGNOSIS — M79609 Pain in unspecified limb: Secondary | ICD-10-CM | POA: Diagnosis not present

## 2013-12-15 ENCOUNTER — Encounter: Payer: Self-pay | Admitting: Podiatry

## 2013-12-15 ENCOUNTER — Ambulatory Visit (INDEPENDENT_AMBULATORY_CARE_PROVIDER_SITE_OTHER): Payer: Medicare Other | Admitting: Podiatry

## 2013-12-15 VITALS — BP 164/72 | HR 95 | Resp 18

## 2013-12-15 DIAGNOSIS — R0989 Other specified symptoms and signs involving the circulatory and respiratory systems: Secondary | ICD-10-CM | POA: Diagnosis not present

## 2013-12-15 DIAGNOSIS — S8290XD Unspecified fracture of unspecified lower leg, subsequent encounter for closed fracture with routine healing: Secondary | ICD-10-CM

## 2013-12-15 DIAGNOSIS — S92251G Displaced fracture of navicular [scaphoid] of right foot, subsequent encounter for fracture with delayed healing: Secondary | ICD-10-CM

## 2013-12-15 NOTE — Patient Instructions (Signed)
Wear CAM boot at all times and remain non-weightbearing. Do not put weight on your right foot.

## 2013-12-15 NOTE — Progress Notes (Signed)
   Subjective:    Patient ID: Derek Johnston, male    DOB: 03/31/1981, 33 y.o.   MRN: [id number]  HPI I AM HERE TO GET MY RESULTS OF MY ct ON MY RIGHT FOOT AND I AM A LITTLE BETTER. Patient states that his swelling has decreased. States that he has not been wearing the boot at all times. Presents today wearing sandals. No new complaints at this time.     Review of Systems  All other systems reviewed and are negative. R foot swelling     Objective:   Physical Exam  Nursing note and vitals reviewed. Constitutional: He is oriented to person, place, and time. He appears well-developed and well-nourished.  Musculoskeletal:  Mild tenderness to palpation over the medial aspect of the foot mostly over the navicular  Neurological: He is alert and oriented to person, place, and time.  Protective sensation intact however vibratory sensation decreased.  Skin: No erythema.  Normal proximal to distal cooling with no significant warmth compared to the contralateral extremity. No open wounds.  Pedal pulses palpable. CRT < 3 sec.         Assessment & Plan:  33 year old male presents for followup of CT result which reveal navicular fracture small avulsion off the lateral cuneiform, could be Charcot. -Discussed at great length with the patient the importance of remaining nonweightbearing and to wear the CAM boot at all times for right now. He was given a prescription for crutches. Discussed the possibility in-depth that this could be Charcot and the significance of that. Discussed that if this is a true Charcot, his foot could continue to break down, leading to deformity and the potential for limb loss.  -Due to the vessel calcifications on x-ray and CT will obtain noninvasive vascular study to evaluate -Patient to return in 3 weeks upon the results of the vascular studies and at that time new x-rays will be performed to further evaluate. If there is no significant change or worsening patient will be  placed into a fiberglass cast (will hold off now so he can get vascular studies). Will consider bone stimulator.

## 2013-12-18 NOTE — Addendum Note (Signed)
Addended by: Ovid CurdWAGONER, Swade Shonka R on: 12/18/2013 01:13 PM   Modules accepted: Orders

## 2013-12-25 ENCOUNTER — Ambulatory Visit (HOSPITAL_COMMUNITY)
Admission: RE | Admit: 2013-12-25 | Discharge: 2013-12-25 | Disposition: A | Discharge status: Home / Self Care | Payer: Medicare Other | Source: Ambulatory Visit | Attending: Podiatry | Admitting: Podiatry

## 2013-12-25 DIAGNOSIS — R0989 Other specified symptoms and signs involving the circulatory and respiratory systems: Secondary | ICD-10-CM | POA: Insufficient documentation

## 2013-12-25 NOTE — Progress Notes (Signed)
Arterial Duplex Lower Ext. Completed. Isabel Ardila, BS, RDMS, RVT  

## 2013-12-29 ENCOUNTER — Ambulatory Visit (INDEPENDENT_AMBULATORY_CARE_PROVIDER_SITE_OTHER): Payer: Medicare Other | Admitting: Ophthalmology

## 2014-01-03 ENCOUNTER — Encounter: Payer: Self-pay | Admitting: Podiatry

## 2014-01-03 ENCOUNTER — Ambulatory Visit (INDEPENDENT_AMBULATORY_CARE_PROVIDER_SITE_OTHER): Payer: Medicare Other | Admitting: Podiatry

## 2014-01-03 ENCOUNTER — Ambulatory Visit (INDEPENDENT_AMBULATORY_CARE_PROVIDER_SITE_OTHER): Payer: Medicare Other

## 2014-01-03 VITALS — BP 152/86 | HR 72 | Resp 12

## 2014-01-03 DIAGNOSIS — A5211 Tabes dorsalis: Secondary | ICD-10-CM | POA: Diagnosis not present

## 2014-01-03 DIAGNOSIS — R52 Pain, unspecified: Secondary | ICD-10-CM | POA: Diagnosis not present

## 2014-01-03 DIAGNOSIS — M79609 Pain in unspecified limb: Secondary | ICD-10-CM

## 2014-01-03 DIAGNOSIS — M14671 Charcot's joint, right ankle and foot: Secondary | ICD-10-CM

## 2014-01-03 DIAGNOSIS — R0989 Other specified symptoms and signs involving the circulatory and respiratory systems: Secondary | ICD-10-CM | POA: Diagnosis not present

## 2014-01-03 NOTE — Patient Instructions (Addendum)
Remain nonweightbearing with crutches. Follow up for cast application. Bring a ride with you as you cannot drive with a cast.

## 2014-01-03 NOTE — Progress Notes (Signed)
Patient ID: Derek Johnston, male   DOB: 24-Nov-1980, 33 y.o.   MRN: [id number]  Subjective: Patient presents today for follow up evaluation of right foot pain/swelling. He presents today wearing a CAM boot, ambulating. He states that he has crutches to remain non-weightbearing but did not use them today. He states he has had decrease in edema and pain to his right foot. He needs to work although it states that he try status he is much as possible.  Patient presents ambulating in CAM Boot, although he continues to drive and take the boot off for that. Denies any systemic complaints such as fevers, chills, nausea, vomiting. No new complaints.  Objective: AAO x3, NAD DP/PT pulses palpable b/l, CRT < 3sec Protective sensation intact however vibratory sensation decreased.  Right foot mild edema with mild warmth.  No open lesions. Pain over the medial aspect of the foot, particularly over the navicular.  Ankle/STJ ROM WNL.  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  Plan: -X-rays were obtained and reviewed with the patient in detail. There does appear to be increase in sclerosis of the navicular with dorsal migration of the navicular compared to prior x-rays. -Recommended strict nonweightbearing in a cast. Patient drove today to the office and therefore cannot applied cast. Discussed with him at great length the importance of remaining nonweightbearing at all times with the cam boot on. Told the patient to come back later this week with a ride and therefore it the cast can be applied at that time. -Discussed again at length risks and complications associated with Charcot and the deformity that can occur which could lead to loss of limb if he continues to walk on it or disease progression. -F/U later this week for cast application or sooner if any problems are to arise or any change in symptoms.

## 2014-01-12 ENCOUNTER — Ambulatory Visit (INDEPENDENT_AMBULATORY_CARE_PROVIDER_SITE_OTHER): Payer: Medicare Other | Admitting: Podiatry

## 2014-01-12 ENCOUNTER — Encounter: Payer: Self-pay | Admitting: Podiatry

## 2014-01-12 ENCOUNTER — Ambulatory Visit (INDEPENDENT_AMBULATORY_CARE_PROVIDER_SITE_OTHER): Payer: Medicare Other | Admitting: Ophthalmology

## 2014-01-12 VITALS — BP 85/56 | HR 116 | Resp 18

## 2014-01-12 DIAGNOSIS — E11359 Type 2 diabetes mellitus with proliferative diabetic retinopathy without macular edema: Secondary | ICD-10-CM

## 2014-01-12 DIAGNOSIS — E1065 Type 1 diabetes mellitus with hyperglycemia: Secondary | ICD-10-CM

## 2014-01-12 DIAGNOSIS — M79676 Pain in unspecified toe(s): Secondary | ICD-10-CM

## 2014-01-12 DIAGNOSIS — H2510 Age-related nuclear cataract, unspecified eye: Secondary | ICD-10-CM

## 2014-01-12 DIAGNOSIS — A5211 Tabes dorsalis: Secondary | ICD-10-CM | POA: Diagnosis not present

## 2014-01-12 DIAGNOSIS — E1039 Type 1 diabetes mellitus with other diabetic ophthalmic complication: Secondary | ICD-10-CM

## 2014-01-12 DIAGNOSIS — H43819 Vitreous degeneration, unspecified eye: Secondary | ICD-10-CM

## 2014-01-12 DIAGNOSIS — M14671 Charcot's joint, right ankle and foot: Secondary | ICD-10-CM

## 2014-01-12 DIAGNOSIS — H35039 Hypertensive retinopathy, unspecified eye: Secondary | ICD-10-CM | POA: Diagnosis not present

## 2014-01-12 DIAGNOSIS — M79609 Pain in unspecified limb: Secondary | ICD-10-CM

## 2014-01-12 DIAGNOSIS — B351 Tinea unguium: Secondary | ICD-10-CM

## 2014-01-12 DIAGNOSIS — I10 Essential (primary) hypertension: Secondary | ICD-10-CM

## 2014-01-12 NOTE — Progress Notes (Signed)
   Subjective:    Patient ID: Derek Johnston, male    DOB: 1980-11-09, 33 y.o.   MRN: [id number]  HPI" I AM DOING OK ON MY RIGHT FOOT"  Derek Johnston since the office they for casting due to right foot Charcot. He did come in today with a right. Denies any changes since last appointment. Continues to have mild swelling and discomfort. Also asking if his nails can be debrided as they are elongated and symptomatic was shoes. He presents today wearing a sandal walking normally, with crutches in hand. No other complaints at this time.    Review of Systems  All other systems reviewed and are negative.      Objective:   Physical Exam AAO x3, NAD Neurovascular status unchanged. Nails elongated, dystrophic, yellow discoloration x10. No surrounding erythema or drainage. Mild edema to the right foot. Continues to have pain in the same area on the medial aspect of his foot overlying the navicular. Overlying skin intact. No open lesions or pre-ulcerative lesions. No leg pain/swelling/warmth      Assessment & Plan:  33 year old male presents for casting due to right foot Charcot; symptomatic nail dystrophy. -Alternatives, risks, complications discussed with patient in detail -The  below knee fiberglass cast applied to the right lower extremity with care taken to pad all bony prominences. Discussed competitions of casting including DVT. Discussed that if any signs or symptoms are to occur he is to call the office immediately or go directly to the emergency room if any are to occur. Discussed with him that he cannot drive while wearing this cast. Also instructed him to stay off of this and uses crutches. -Nails sharply debrided x10 without complications. -Followup in 3 weeks or sooner if any palms are to arise or any change in symptoms.  -Next appointment x-ray, recast.

## 2014-01-12 NOTE — Patient Instructions (Signed)
Cast Care The purpose of a cast is to protect and immobilize an injured part of the body. This may be necessary after fractures, surgery, or other injuries. Splints are another form of immobilization; however, they are usually not as rigid as a cast, which accommodates swelling of the injury while still maintaining immobilization. Splints are typically used in the immediate post injury or postoperative period, before changing to a cast.  Before the rigid material of a cast is formed, a layer of padding is first applied to protect the injury. The rigid portion of a cast is created by wrapping gauze saturated with plaster of paris around the injury; alternatively, the cast may be made of fiberglass. During the period of immobilization, a cast may need to be changed multiple times. The length of immobilization is dependent on the severity of the injury and the time needed for healing. This period can vary from a couple weeks to many months. After a cast is created, radiographs (X-rays) through the cast determine if a satisfactory alignment of the bones was achieved. Radiographs may also be used throughout the healing period to check for signs of bone healing.  CARE OF THE CAST   Allow the cast to dry and harden completely before applying any pressure to it.  Applying pressure too early may create a point of excessive pressure on the skin, which may increase the risk of forming an ulcer. Drying time depends on the type of cast but may last as long as 24 hours.  When a cast gets wet, a soft area may appear. If this happens accidentally, return to the doctor's office, emergency room, or outpatient surgical facility as soon as possible for repairs or changing of the cast.  Do not get sand in the cast.  Do not place anything inside the cast. This includes items intended to scratch an area of skin that itches. ITCHING INSIDE A CAST   It is very common for a person with a cast to experience an itching  sensation under the cast.  Do not scratch any area under the cast even if the area is within reach. The skin under a cast in an environment of increased risk for injury.  Do not put anything in the cast.  If there is no wound, such as an incision from surgery, you may sprinkle cornstarch into the cast to relieve itching.  If a wound is present under the cast, consult your caregiver for pain medication or medication to reduce itching.  Using a hairdryer (on the cold setting) is a useful technique for reducing an itching sensation. CARE OF THE PATIENT IN A CAST It is important to elevate the body part that is in a cast to a level equal to or above that of the heart whenever possible. Elevating the injured body part may reduce the likelihood of swelling. Elevation of a leg in a cast may be achieved by resting the leg on a pillow when in bed and on a footstool or chair when sitting. For an arm in a cast, rest the arm on a pillow placed on the chest.  No matter how well one follows the necessary precautions, excessive swelling may occur under the cast. Signs and symptoms of excessive swelling include:  Severe and persistent pain.  Change in color of the tissues beyond the end of the cast, such as a change to blue or gray under the nails of the fingers or toes.  Coldness of the tissues beyond the cast when   the rest of the body is warm.  Numbness or complete loss of feeling in the skin beyond the cast.  Feeling of tightness under the cast after it dries.  Swelling of the tissue to a greater extent than was present before the cast was applied.  For a leg cast, inability to raise the big toe. If any of these signs or symptoms occur, contact your caregiver or an emergency room as soon as possible for treatment.  Infection Inside a Cast On occasion, an injury may become infected during healing. The most important way to fight an infection is to detect it early; however, early detection may be  difficult if the infected area is covered by a cast. Infection should be reported immediately to your caregiver. The following are common signs and symptoms of infection:   Foul smell.  Fever greater than 101F (38.3C) (may be accompanied by a general ill feeling).  Leakage of fluid through the cast.  Increasing pain or soreness of the skin under the cast. Bathing with a Cast Bathing is often a difficult task with a cast. The cast must be kept dry at all times, unless otherwise specified by your caregiver. If the cast is on a limb, such as your arm or leg, it is often easier to take a bath with the extremity in a cast propped up on the side of the tub or a chair, out of the water. If the cast is on the trunk of the body, you should take sponge baths until the cast is removed.  Document Released: 04/20/2005 Document Revised: 09/04/2013 Document Reviewed: 08/02/2008 Exit[employer] Patient Information 2015 [employer]. This information is not intended to replace advice given to you by your health care provider. Make sure you discuss any questions you have with your health care provider.  

## 2014-01-24 DIAGNOSIS — D631 Anemia in chronic kidney disease: Secondary | ICD-10-CM | POA: Diagnosis not present

## 2014-01-24 DIAGNOSIS — E1129 Type 2 diabetes mellitus with other diabetic kidney complication: Secondary | ICD-10-CM | POA: Diagnosis not present

## 2014-01-24 DIAGNOSIS — N2581 Secondary hyperparathyroidism of renal origin: Secondary | ICD-10-CM | POA: Diagnosis not present

## 2014-01-24 DIAGNOSIS — N189 Chronic kidney disease, unspecified: Secondary | ICD-10-CM | POA: Diagnosis not present

## 2014-01-24 DIAGNOSIS — G609 Hereditary and idiopathic neuropathy, unspecified: Secondary | ICD-10-CM | POA: Diagnosis not present

## 2014-01-24 DIAGNOSIS — R809 Proteinuria, unspecified: Secondary | ICD-10-CM | POA: Diagnosis not present

## 2014-02-02 ENCOUNTER — Ambulatory Visit (INDEPENDENT_AMBULATORY_CARE_PROVIDER_SITE_OTHER): Payer: Medicare Other

## 2014-02-02 ENCOUNTER — Ambulatory Visit (INDEPENDENT_AMBULATORY_CARE_PROVIDER_SITE_OTHER): Payer: Medicare Other | Admitting: Podiatry

## 2014-02-02 DIAGNOSIS — S92251G Displaced fracture of navicular [scaphoid] of right foot, subsequent encounter for fracture with delayed healing: Secondary | ICD-10-CM

## 2014-02-02 DIAGNOSIS — E11610 Type 2 diabetes mellitus with diabetic neuropathic arthropathy: Secondary | ICD-10-CM

## 2014-02-02 NOTE — Patient Instructions (Signed)
Cast Care The purpose of a cast is to protect and immobilize an injured part of the body. This may be necessary after fractures, surgery, or other injuries. Splints are another form of immobilization; however, they are usually not as rigid as a cast, which accommodates swelling of the injury while still maintaining immobilization. Splints are typically used in the immediate post injury or postoperative period, before changing to a cast.  Before the rigid material of a cast is formed, a layer of padding is first applied to protect the injury. The rigid portion of a cast is created by wrapping gauze saturated with plaster of paris around the injury; alternatively, the cast may be made of fiberglass. During the period of immobilization, a cast may need to be changed multiple times. The length of immobilization is dependent on the severity of the injury and the time needed for healing. This period can vary from a couple weeks to many months. After a cast is created, radiographs (X-rays) through the cast determine if a satisfactory alignment of the bones was achieved. Radiographs may also be used throughout the healing period to check for signs of bone healing.  CARE OF THE CAST   Allow the cast to dry and harden completely before applying any pressure to it.  Applying pressure too early may create a point of excessive pressure on the skin, which may increase the risk of forming an ulcer. Drying time depends on the type of cast but may last as long as 24 hours.  When a cast gets wet, a soft area may appear. If this happens accidentally, return to the doctor's office, emergency room, or outpatient surgical facility as soon as possible for repairs or changing of the cast.  Do not get sand in the cast.  Do not place anything inside the cast. This includes items intended to scratch an area of skin that itches. ITCHING INSIDE A CAST   It is very common for a person with a cast to experience an itching  sensation under the cast.  Do not scratch any area under the cast even if the area is within reach. The skin under a cast in an environment of increased risk for injury.  Do not put anything in the cast.  If there is no wound, such as an incision from surgery, you may sprinkle cornstarch into the cast to relieve itching.  If a wound is present under the cast, consult your caregiver for pain medication or medication to reduce itching.  Using a hairdryer (on the cold setting) is a useful technique for reducing an itching sensation. CARE OF THE PATIENT IN A CAST It is important to elevate the body part that is in a cast to a level equal to or above that of the heart whenever possible. Elevating the injured body part may reduce the likelihood of swelling. Elevation of a leg in a cast may be achieved by resting the leg on a pillow when in bed and on a footstool or chair when sitting. For an arm in a cast, rest the arm on a pillow placed on the chest.  No matter how well one follows the necessary precautions, excessive swelling may occur under the cast. Signs and symptoms of excessive swelling include:  Severe and persistent pain.  Change in color of the tissues beyond the end of the cast, such as a change to blue or gray under the nails of the fingers or toes.  Coldness of the tissues beyond the cast when   the rest of the body is warm.  Numbness or complete loss of feeling in the skin beyond the cast.  Feeling of tightness under the cast after it dries.  Swelling of the tissue to a greater extent than was present before the cast was applied.  For a leg cast, inability to raise the big toe. If any of these signs or symptoms occur, contact your caregiver or an emergency room as soon as possible for treatment.  Infection Inside a Cast On occasion, an injury may become infected during healing. The most important way to fight an infection is to detect it early; however, early detection may be  difficult if the infected area is covered by a cast. Infection should be reported immediately to your caregiver. The following are common signs and symptoms of infection:   Foul smell.  Fever greater than 101F (38.3C) (may be accompanied by a general ill feeling).  Leakage of fluid through the cast.  Increasing pain or soreness of the skin under the cast. Bathing with a Cast Bathing is often a difficult task with a cast. The cast must be kept dry at all times, unless otherwise specified by your caregiver. If the cast is on a limb, such as your arm or leg, it is often easier to take a bath with the extremity in a cast propped up on the side of the tub or a chair, out of the water. If the cast is on the trunk of the body, you should take sponge baths until the cast is removed.  Document Released: 04/20/2005 Document Revised: 09/04/2013 Document Reviewed: 08/02/2008 Exit[employer] Patient Information 2015 [employer]. This information is not intended to replace advice given to you by your health care provider. Make sure you discuss any questions you have with your health care provider.  

## 2014-02-03 DIAGNOSIS — E11610 Type 2 diabetes mellitus with diabetic neuropathic arthropathy: Secondary | ICD-10-CM | POA: Insufficient documentation

## 2014-02-03 NOTE — Progress Notes (Signed)
Patient ID: Derek Johnston, male   DOB: 07/28/1980, 33 y.o.   MRN: [id number]  Subjective: [name] returns the office they for followup evaluation of right foot Charcot, navicular fracture. He states that he has continued with the cast and he is been trying to stay off as much as possible. Denies any significant pain to the area. Denies any problems the cast. No acute changes since last appointment.  Objective: AAO x3, NAD DP/PT pulses palplable b/l. CRT < sec Decreased vibratory sensation. Protective sensation intact with Dorann OuSimms Weinstein monofilament. Achilles tendon reflex intact  Cast dirty on the plantar aspect of the foot and was removed. Decrease edema although still persists on the medial aspect of the foot and ankle. No tenderness to palpation of the medial aspect of the foot or the navicular. No open skin lesions. No calf pain, swelling, warmth.  Assessment: 33 year old male with navicular fracture/Charcot foot   Plan: -X-rays were obtained and reviewed the patient. -Conservative versus surgical treatment were discussed including alternatives, risks, complications. -Cast was removed. He has had no problems with the cast and there is no skin breakdown. -New below knee fiberglass cast applied making sure to pad all bony prominences. Risks and complications of casting discussed with patient including but not limited DVT. Again discussed that if he is to have any symptoms to call the office or go directly to the emergency room. Also call the office if he feels that the cast is too tight or causing any skin are taken. -Discussed the patient that he will likely be continue in the cast for period of time until this consolidation on x-ray. Once heel he'll likely be left with significant arthritic changes in the midfoot. Also discussed with him that if we stop casting early he can continue to breakdown and he can have deformity to his foot. Discussed with him that if she were to walk he risks  breaking down the foot and delayed healing. -Followup in 3 weeks or sooner if any problems are to arise or any change in symptoms. In the meantime call any questions, concerns, change in symptoms to

## 2014-02-23 ENCOUNTER — Ambulatory Visit (INDEPENDENT_AMBULATORY_CARE_PROVIDER_SITE_OTHER): Payer: Medicare Other

## 2014-02-23 ENCOUNTER — Ambulatory Visit (INDEPENDENT_AMBULATORY_CARE_PROVIDER_SITE_OTHER): Payer: Medicare Other | Admitting: Podiatry

## 2014-02-23 ENCOUNTER — Encounter: Payer: Self-pay | Admitting: Podiatry

## 2014-02-23 VITALS — BP 135/76 | HR 85 | Resp 16

## 2014-02-23 DIAGNOSIS — E11610 Type 2 diabetes mellitus with diabetic neuropathic arthropathy: Secondary | ICD-10-CM

## 2014-02-23 DIAGNOSIS — S92251G Displaced fracture of navicular [scaphoid] of right foot, subsequent encounter for fracture with delayed healing: Secondary | ICD-10-CM

## 2014-02-24 ENCOUNTER — Encounter: Payer: Self-pay | Admitting: Podiatry

## 2014-02-24 NOTE — Progress Notes (Signed)
Patient ID: Derek RedbirdDewayne E Johnston, male   DOB: 04/17/1981, 33 y.o.   MRN: [id number]  Subjective: Patient returns the office they for followup of valuation of right foot Charcot, navicular fracture. States that he has remained nonweightbearing in a cast. Denies any pain to the area. No problems with the cast. Denies any acute changes since last appointment. No other complaints at this time.  Objective: AAO x3, NAD DP/PT pulses palpable bilaterally, CRT less than 3 seconds Protective sensation intact with Simms Weinstein monofilament, decreased vibratory sensation, Achilles tendon reflex intact Decrease edema to the right foot. There is no tenderness over the medial aspect of the foot overlying the navicular, posterior tibial tendon or other areas of the foot. There is no open lesions, erythema. No significant deformity to the foot. No calf pain with compression, swelling, warmth.  Assessment: 33 year old male follow up for  right foot navicular fracture/Charcot  Plan: -X-rays were obtained and reviewed the patient. -Cast was removed and a new below-knee fiberglass cast was applied making sure to pad all bony prominences. Risks and complications discussed with the patient casting including but not limited to DVT and other complications. If any are to occur to go directly to the emergency room. -Will contact [employer] for bone stimulator. (Will likely get at next appointment).  -Discussed the patient that he will likely remain nonweightbearing for an extended time until there is consolidation on x-ray. After that we'll likely go into a CROW walker.  -At next appointment will x-ray and likely apply a new cast. -Followup in 3 weeks or sooner if any problems are to arise or any change in symptoms. In the meantime call the office with any questions, concerns, change in symptoms.

## 2014-03-16 ENCOUNTER — Ambulatory Visit (INDEPENDENT_AMBULATORY_CARE_PROVIDER_SITE_OTHER): Payer: Medicare Other

## 2014-03-16 ENCOUNTER — Encounter: Payer: Self-pay | Admitting: Podiatry

## 2014-03-16 ENCOUNTER — Ambulatory Visit (INDEPENDENT_AMBULATORY_CARE_PROVIDER_SITE_OTHER): Payer: Medicare Other | Admitting: Podiatry

## 2014-03-16 VITALS — BP 131/92 | HR 91 | Resp 13

## 2014-03-16 DIAGNOSIS — S92251G Displaced fracture of navicular [scaphoid] of right foot, subsequent encounter for fracture with delayed healing: Secondary | ICD-10-CM | POA: Diagnosis not present

## 2014-03-16 DIAGNOSIS — E11610 Type 2 diabetes mellitus with diabetic neuropathic arthropathy: Secondary | ICD-10-CM

## 2014-03-16 NOTE — Patient Instructions (Signed)
Cast Care The purpose of a cast is to protect and immobilize an injured part of the body. This may be necessary after fractures, surgery, or other injuries. Splints are another form of immobilization; however, they are usually not as rigid as a cast, which accommodates swelling of the injury while still maintaining immobilization. Splints are typically used in the immediate post injury or postoperative period, before changing to a cast.  Before the rigid material of a cast is formed, a layer of padding is first applied to protect the injury. The rigid portion of a cast is created by wrapping gauze saturated with plaster of paris around the injury; alternatively, the cast may be made of fiberglass. During the period of immobilization, a cast may need to be changed multiple times. The length of immobilization is dependent on the severity of the injury and the time needed for healing. This period can vary from a couple weeks to many months. After a cast is created, radiographs (X-rays) through the cast determine if a satisfactory alignment of the bones was achieved. Radiographs may also be used throughout the healing period to check for signs of bone healing.  CARE OF THE CAST   Allow the cast to dry and harden completely before applying any pressure to it.  Applying pressure too early may create a point of excessive pressure on the skin, which may increase the risk of forming an ulcer. Drying time depends on the type of cast but may last as long as 24 hours.  When a cast gets wet, a soft area may appear. If this happens accidentally, return to the doctor's office, emergency room, or outpatient surgical facility as soon as possible for repairs or changing of the cast.  Do not get sand in the cast.  Do not place anything inside the cast. This includes items intended to scratch an area of skin that itches. ITCHING INSIDE A CAST   It is very common for a person with a cast to experience an itching  sensation under the cast.  Do not scratch any area under the cast even if the area is within reach. The skin under a cast in an environment of increased risk for injury.  Do not put anything in the cast.  If there is no wound, such as an incision from surgery, you may sprinkle cornstarch into the cast to relieve itching.  If a wound is present under the cast, consult your caregiver for pain medication or medication to reduce itching.  Using a hairdryer (on the cold setting) is a useful technique for reducing an itching sensation. CARE OF THE PATIENT IN A CAST It is important to elevate the body part that is in a cast to a level equal to or above that of the heart whenever possible. Elevating the injured body part may reduce the likelihood of swelling. Elevation of a leg in a cast may be achieved by resting the leg on a pillow when in bed and on a footstool or chair when sitting. For an arm in a cast, rest the arm on a pillow placed on the chest.  No matter how well one follows the necessary precautions, excessive swelling may occur under the cast. Signs and symptoms of excessive swelling include:  Severe and persistent pain.  Change in color of the tissues beyond the end of the cast, such as a change to blue or gray under the nails of the fingers or toes.  Coldness of the tissues beyond the cast when   the rest of the body is warm.  Numbness or complete loss of feeling in the skin beyond the cast.  Feeling of tightness under the cast after it dries.  Swelling of the tissue to a greater extent than was present before the cast was applied.  For a leg cast, inability to raise the big toe. If any of these signs or symptoms occur, contact your caregiver or an emergency room as soon as possible for treatment.  Infection Inside a Cast On occasion, an injury may become infected during healing. The most important way to fight an infection is to detect it early; however, early detection may be  difficult if the infected area is covered by a cast. Infection should be reported immediately to your caregiver. The following are common signs and symptoms of infection:   Foul smell.  Fever greater than 101F (38.3C) (may be accompanied by a general ill feeling).  Leakage of fluid through the cast.  Increasing pain or soreness of the skin under the cast. Bathing with a Cast Bathing is often a difficult task with a cast. The cast must be kept dry at all times, unless otherwise specified by your caregiver. If the cast is on a limb, such as your arm or leg, it is often easier to take a bath with the extremity in a cast propped up on the side of the tub or a chair, out of the water. If the cast is on the trunk of the body, you should take sponge baths until the cast is removed.  Document Released: 04/20/2005 Document Revised: 09/04/2013 Document Reviewed: 08/02/2008 Exit[employer] Patient Information 2015 [employer]. This information is not intended to replace advice given to you by your health care provider. Make sure you discuss any questions you have with your health care provider.  

## 2014-03-19 ENCOUNTER — Encounter: Payer: Self-pay | Admitting: Podiatry

## 2014-03-19 NOTE — Progress Notes (Addendum)
Patient ID: Derek RedbirdDewayne E Johnston, male   DOB: 11/15/1980, 33 y.o.   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 He states that he has been walking on his cast since last appointment. He he denies any significant pain to the area. Also feels that his swelling has decreased as his cast is loose. Denies any acute changes since last appointment. Denies any systemic complaints as fevers, chills, nausea, vomiting. No other complaints at this time.  Objective: AAO 3, NAD Neurovascular status unchanged. Cast was significantly dirty and there was a hole in the heel from what the patient has been walking on it. There is decrease edema to the right lower extremity. There is mild tenderness over the medial aspect of the foot overlying the navicular. There is decreased warmth overlying the area. There is no overlying erythema. No open lesions or pre-ulcer lesions identified. There is no significant deformity to the foot. There is no calf pain with compression, swelling, warmth, erythema.  Assessment: 33 year old male with nonhealing right navicular fracture/Charcot  Plan: -X-rays were obtained and reviewed with the patient.There continues to be radiolucent lines within the navicular consistent with non-healing at the navicular fracture. There is also sclerosis of the navicular. No significant deformity. -At this time recommended continued immobilization in a cast due to nonunion. The patient was then placed into a well-padded below-knee fiberglass cast made sure to pad all bony prominences. Again discussed with patient risks and complications of immobilization including but not limited to DVT. If any are to occur history the dressing to the emergency room. -Due to nonunion of the navicular fracture we'll proceed with bone stimulator. -Follow-up in 4 weeks or repeat x-rays and cast change. In the meantime, call the office any  questions, concerns, change in symptoms.

## 2014-04-12 ENCOUNTER — Encounter (HOSPITAL_COMMUNITY): Payer: Self-pay | Admitting: Vascular Surgery

## 2014-04-13 ENCOUNTER — Encounter: Payer: Self-pay | Admitting: Podiatry

## 2014-04-13 ENCOUNTER — Other Ambulatory Visit: Payer: Self-pay | Admitting: *Deleted

## 2014-04-13 ENCOUNTER — Ambulatory Visit (INDEPENDENT_AMBULATORY_CARE_PROVIDER_SITE_OTHER): Payer: Medicare Other

## 2014-04-13 ENCOUNTER — Other Ambulatory Visit: Payer: Self-pay | Admitting: Podiatry

## 2014-04-13 ENCOUNTER — Ambulatory Visit (INDEPENDENT_AMBULATORY_CARE_PROVIDER_SITE_OTHER): Payer: Medicare Other | Admitting: Podiatry

## 2014-04-13 VITALS — BP 142/86 | HR 72 | Resp 12

## 2014-04-13 DIAGNOSIS — S92251A Displaced fracture of navicular [scaphoid] of right foot, initial encounter for closed fracture: Secondary | ICD-10-CM

## 2014-04-13 DIAGNOSIS — M14671 Charcot's joint, right ankle and foot: Secondary | ICD-10-CM | POA: Diagnosis not present

## 2014-04-13 NOTE — Patient Instructions (Signed)
Wear CAM walker at all times. Go to [employer] to get fitted for CROW walker. Once you get the new CROW walker, you can start wearing that but do not put weight on your foot until follow-up appt and new x-ray.  If you have any increase in swelling, pain, redness call the office.

## 2014-04-16 ENCOUNTER — Encounter: Payer: Self-pay | Admitting: Podiatry

## 2014-04-16 NOTE — Progress Notes (Signed)
Patient ID: Derek RedbirdDewayne E Lawlor, male   DOB: 02/08/1981, 33 y.o.   MRN: [id number]  Subjective: 33 year old male returns the office they for follow-up evaluation of nonunion of navicular fracture, Charcot right foot. Patient states that he has been nonweightbearing as much as possible. He has also been using the bone stimulation although he has missed 1 day. Denies any systemic complaints such as fevers, chills, nausea, vomiting. No other complaints at this time in no acute changes since last appointment. Denies any calf pain, shortness of breath or chest pain.  Objective: AAO 3, NAD DP/PT pulses palpable bilaterally, CRT less than 3 seconds Decreased vibratory sensation. Protective sensation appears to be intact with Dorann OuSimms Weinstein monofilament There is decreased edema in warmth to the right foot compared to prior evaluation. There is mild discomfort overlying the medial aspects of the right foot although again decreased from prior appointment. There is no other areas of pinpoint bony tenderness or pain with vibratory sensation. No open lesions or pre-ulcerative lesions. No pain with calf compression, swelling, warmth, erythema.  Assessment: 33 year old male with nonunion navicular fracture/Charcot  Plan: -X-rays were obtained and reviewed with the patient. There does appear to be consolidation compared to prior evaluation. -Treatment options were discussed including alternatives, risks, complications. -At this time would like to proceed with having the patient fitted for a CROW walker. A prescription was given to the patient for him to go to [facility] to be fitted. [facility] of this[facility]. Discussed with the patient that he is to remain nonweightbearing until further directed and he is to continue with the bone stimulator.  -Discussed the patient that he has any increase in swelling or warmth the foot to call the office immediately for  recasting. -Follow-up in approximately 3-4 weeks or sooner if any problems are to arise. At that time x-rays will be obtained. With the patient is fitted for a CROW walker will likely transition to weightbearing in the CROW.

## 2014-04-24 ENCOUNTER — Telehealth: Payer: Self-pay | Admitting: *Deleted

## 2014-04-24 NOTE — Telephone Encounter (Signed)
Has the swelling increased, is there any warmth? Did he get casted for the CROW walker yet? If so, and he has increased warmth/swelling he should come in for a fiberglass cast.

## 2014-04-24 NOTE — Telephone Encounter (Addendum)
Pt states he is in a removable boot after being in a cast for 3 months, but continues to have swelling.  Left message conveying Dr. Gabriel RungWagoner's concern the pt's leg was not having an increase in warmth and or swelling and if he had the CROW walker, and to please call with his status.  I left message reiterating Dr. Gabriel RungWagoner's concerns and asked for a callback with pt's status or encouraged pt to set up an appt as soon as this afternoon.

## 2014-05-01 ENCOUNTER — Encounter: Payer: Self-pay | Admitting: Podiatry

## 2014-05-01 ENCOUNTER — Ambulatory Visit (INDEPENDENT_AMBULATORY_CARE_PROVIDER_SITE_OTHER): Payer: Medicare Other | Admitting: Podiatry

## 2014-05-01 ENCOUNTER — Ambulatory Visit (INDEPENDENT_AMBULATORY_CARE_PROVIDER_SITE_OTHER): Payer: Medicare Other

## 2014-05-01 VITALS — BP 146/74 | HR 86 | Resp 18

## 2014-05-01 DIAGNOSIS — B351 Tinea unguium: Secondary | ICD-10-CM | POA: Diagnosis not present

## 2014-05-01 DIAGNOSIS — S92251G Displaced fracture of navicular [scaphoid] of right foot, subsequent encounter for fracture with delayed healing: Secondary | ICD-10-CM | POA: Diagnosis not present

## 2014-05-01 DIAGNOSIS — S92301A Fracture of unspecified metatarsal bone(s), right foot, initial encounter for closed fracture: Secondary | ICD-10-CM

## 2014-05-01 DIAGNOSIS — M79676 Pain in unspecified toe(s): Secondary | ICD-10-CM

## 2014-05-01 DIAGNOSIS — M14671 Charcot's joint, right ankle and foot: Secondary | ICD-10-CM

## 2014-05-01 NOTE — Progress Notes (Signed)
Patient ID: Derek RedbirdDewayne E Johnston, male   DOB: 06/10/1980, 33 y.o.   MRN: [id number]  Subjective: 33 year old male returns the office they for follow-up evaluation of nonunion of navicular fracture and Charcot. The patient states that since last appointment he has gone to [facility] and been fitted for a [employer] walk[employer]. He states that he should arrive in approximately 2 weeks. Since last appointment he's been continuing the CAM boot. However, he has been tried and not wearing the boot at all times. He states that he periodically gets and swelling to the area when wearing the boot. Denies increase in warmth or redness over the foot. Patient is also asking for nail debridement as they are elongated and painful particularly with shoes and wearing the boot. Denies any recent redness or drainage from the nail sites. No acute changes since last appointment and no other complaints at this time. Denies any systemic complaints fevers, chills, nausea, vomiting. Denies any chest pain, shortness of breath, leg pain.  Objective: AAO 3, NAD DP/PT pulses palpable bilaterally, CRT less than 3 seconds Protective sensation appears to be intact with Dorann OuSimms Weinstein monofilament, decreased vibratory sensation. There is mild edema overlying the right foot. There is no increase in warmth compared to the contralateral extremity no erythema. There is no tenderness to palpation or pain with vibratory sensation. No open lesions or pre-ulcerative lesions. No pain with calf compression, swelling, warmth, erythema. Nails hypertrophic, dystrophic, elongated, brittle, discolored 10. No swelling erythema or drainage from the nail sites.  Assessment: 33 year old male with delayed healing navicular fracture/Charcot right foot; symptomatic onychomycosis  Plan: -X-rays were obtained and reviewed with the patient. -Treatment options were discussed with the patient including alternatives, risks, complications. -Continue nonweightbearing and a CAM  boot. Discussed the patient is to remain nonweightbearing at all times [facility] Discussed the patient he cannot drive with the boot and he should hold off on driving. -ACE warps dispensed to help with swelling.  -Currently, other than swelling which is still improved compared to prior evaluations, there are no other signs/symptoms of acute charcot.  -Continue bone stimulator -Follow-up after CROW walker arrives. At that time will obtain new x-rays and will likely transition to WB in the CROW. In the meantime, call the office with any questions/concerns/change in symptoms.

## 2014-05-09 ENCOUNTER — Ambulatory Visit: Payer: Medicare Other | Admitting: Podiatry

## 2014-05-28 DIAGNOSIS — Z940 Kidney transplant status: Secondary | ICD-10-CM | POA: Diagnosis not present

## 2014-05-28 DIAGNOSIS — Z79899 Other long term (current) drug therapy: Secondary | ICD-10-CM | POA: Diagnosis not present

## 2014-05-29 ENCOUNTER — Ambulatory Visit (INDEPENDENT_AMBULATORY_CARE_PROVIDER_SITE_OTHER): Payer: Medicare Other

## 2014-05-29 ENCOUNTER — Ambulatory Visit (INDEPENDENT_AMBULATORY_CARE_PROVIDER_SITE_OTHER): Payer: Medicare Other | Admitting: Podiatry

## 2014-05-29 ENCOUNTER — Encounter: Payer: Self-pay | Admitting: Podiatry

## 2014-05-29 VITALS — BP 165/87 | HR 86 | Resp 18

## 2014-05-29 DIAGNOSIS — E11610 Type 2 diabetes mellitus with diabetic neuropathic arthropathy: Secondary | ICD-10-CM

## 2014-05-29 DIAGNOSIS — S92251G Displaced fracture of navicular [scaphoid] of right foot, subsequent encounter for fracture with delayed healing: Secondary | ICD-10-CM

## 2014-05-29 NOTE — Progress Notes (Signed)
   Subjective:    Patient ID: Derek Johnston, male    DOB: 02/07/1981, 34 y.o.   MRN: [id number]  HPI  34 year old male returns the office they for follow-up evaluation of nonhealing navicular fracture, Charcot foot to the right. He states that he has been continuing to be nonweightbearing as much as possible and a CAM walker however he does walk at times in the boot. He states the swelling is intermittent. Denies any recent. Increase in warmth or redness overlying the right foot/leg. He has been continuing with the bone stimulator. No other complaints time. No acute changes since last appointment.    Review of Systems  All other systems reviewed and are negative.      Objective:   Physical Exam AAO 3, NAD DP/PT pulses palpable, CRT less than 3 seconds Protective sensation appears to be intact with Dorann OuSimms Weinstein monofilament, decreased vibratory sensation. There is mild edema overlying the right foot although does not appear to be increased compared to prior pointed. There is no overlying erythema or increase in warmth. There is no open lesions or pre-ulcerative lesions. There is no significant deformity to the foot compared to the contralateral extremity. No pain with calf compression, swelling, warmth, erythema.     Assessment & Plan:  34 year old male with right navicular fracture/Charcot with evidence of healing. -X-rays were obtained and reviewed the patient. There does appear to be consolidation across navicular fracture site. There is no significant deformity identified. -At this time he did bring the CROW walker to the office. He can start to transition into CROW walker, WBAT. -Discussed with the patient in great detail that if he has any reoccurrence of increased swelling, redness, warmth the foot to call the office immediately and begin nonweightbearing. -Continue bone similar. -Follow-up in 4 weeks or sooner should any palms arise. In the meantime, encouraged to call the  office with any questions, concerns, change in symptoms.

## 2014-05-30 ENCOUNTER — Encounter: Payer: Self-pay | Admitting: Podiatry

## 2014-06-06 DIAGNOSIS — D631 Anemia in chronic kidney disease: Secondary | ICD-10-CM | POA: Diagnosis not present

## 2014-06-06 DIAGNOSIS — N189 Chronic kidney disease, unspecified: Secondary | ICD-10-CM | POA: Diagnosis not present

## 2014-06-06 DIAGNOSIS — E1129 Type 2 diabetes mellitus with other diabetic kidney complication: Secondary | ICD-10-CM | POA: Diagnosis not present

## 2014-06-06 DIAGNOSIS — R809 Proteinuria, unspecified: Secondary | ICD-10-CM | POA: Diagnosis not present

## 2014-06-08 DIAGNOSIS — E11610 Type 2 diabetes mellitus with diabetic neuropathic arthropathy: Secondary | ICD-10-CM

## 2014-06-15 DIAGNOSIS — E785 Hyperlipidemia, unspecified: Secondary | ICD-10-CM | POA: Diagnosis not present

## 2014-06-15 DIAGNOSIS — N2581 Secondary hyperparathyroidism of renal origin: Secondary | ICD-10-CM | POA: Diagnosis not present

## 2014-06-15 DIAGNOSIS — B349 Viral infection, unspecified: Secondary | ICD-10-CM | POA: Diagnosis not present

## 2014-06-20 DIAGNOSIS — N2589 Other disorders resulting from impaired renal tubular function: Secondary | ICD-10-CM | POA: Diagnosis not present

## 2014-06-20 DIAGNOSIS — Z79899 Other long term (current) drug therapy: Secondary | ICD-10-CM | POA: Diagnosis not present

## 2014-06-20 DIAGNOSIS — R635 Abnormal weight gain: Secondary | ICD-10-CM | POA: Diagnosis not present

## 2014-06-20 DIAGNOSIS — B338 Other specified viral diseases: Secondary | ICD-10-CM | POA: Diagnosis not present

## 2014-06-20 DIAGNOSIS — I10 Essential (primary) hypertension: Secondary | ICD-10-CM | POA: Diagnosis not present

## 2014-06-20 DIAGNOSIS — Z7952 Long term (current) use of systemic steroids: Secondary | ICD-10-CM | POA: Diagnosis not present

## 2014-06-20 DIAGNOSIS — Z940 Kidney transplant status: Secondary | ICD-10-CM | POA: Diagnosis not present

## 2014-06-20 DIAGNOSIS — D8989 Other specified disorders involving the immune mechanism, not elsewhere classified: Secondary | ICD-10-CM | POA: Diagnosis not present

## 2014-06-20 DIAGNOSIS — Z48288 Encounter for aftercare following multiple organ transplant: Secondary | ICD-10-CM | POA: Diagnosis not present

## 2014-06-20 DIAGNOSIS — Z792 Long term (current) use of antibiotics: Secondary | ICD-10-CM | POA: Diagnosis not present

## 2014-06-20 DIAGNOSIS — Z7982 Long term (current) use of aspirin: Secondary | ICD-10-CM | POA: Diagnosis not present

## 2014-06-20 DIAGNOSIS — Z9483 Pancreas transplant status: Secondary | ICD-10-CM | POA: Diagnosis not present

## 2014-06-29 ENCOUNTER — Ambulatory Visit: Payer: Medicare Other | Admitting: Podiatry

## 2014-07-04 DIAGNOSIS — Z48288 Encounter for aftercare following multiple organ transplant: Secondary | ICD-10-CM | POA: Diagnosis not present

## 2014-07-06 ENCOUNTER — Ambulatory Visit: Payer: Medicare Other | Admitting: Podiatry

## 2014-07-10 DIAGNOSIS — Z792 Long term (current) use of antibiotics: Secondary | ICD-10-CM | POA: Diagnosis not present

## 2014-07-10 DIAGNOSIS — Z7952 Long term (current) use of systemic steroids: Secondary | ICD-10-CM | POA: Diagnosis not present

## 2014-07-10 DIAGNOSIS — Z5181 Encounter for therapeutic drug level monitoring: Secondary | ICD-10-CM | POA: Diagnosis not present

## 2014-07-10 DIAGNOSIS — Z79899 Other long term (current) drug therapy: Secondary | ICD-10-CM | POA: Diagnosis not present

## 2014-07-10 DIAGNOSIS — B9789 Other viral agents as the cause of diseases classified elsewhere: Secondary | ICD-10-CM | POA: Diagnosis not present

## 2014-07-10 DIAGNOSIS — Z4822 Encounter for aftercare following kidney transplant: Secondary | ICD-10-CM | POA: Diagnosis not present

## 2014-07-10 DIAGNOSIS — I10 Essential (primary) hypertension: Secondary | ICD-10-CM | POA: Diagnosis not present

## 2014-07-10 DIAGNOSIS — E782 Mixed hyperlipidemia: Secondary | ICD-10-CM | POA: Diagnosis not present

## 2014-07-10 DIAGNOSIS — E872 Acidosis: Secondary | ICD-10-CM | POA: Diagnosis not present

## 2014-07-10 DIAGNOSIS — E10319 Type 1 diabetes mellitus with unspecified diabetic retinopathy without macular edema: Secondary | ICD-10-CM | POA: Diagnosis not present

## 2014-07-10 DIAGNOSIS — Z940 Kidney transplant status: Secondary | ICD-10-CM | POA: Diagnosis not present

## 2014-07-10 DIAGNOSIS — E785 Hyperlipidemia, unspecified: Secondary | ICD-10-CM | POA: Diagnosis not present

## 2014-07-10 DIAGNOSIS — Z4823 Encounter for aftercare following liver transplant: Secondary | ICD-10-CM | POA: Diagnosis not present

## 2014-07-11 DIAGNOSIS — Z4822 Encounter for aftercare following kidney transplant: Secondary | ICD-10-CM | POA: Diagnosis not present

## 2014-07-11 DIAGNOSIS — Z940 Kidney transplant status: Secondary | ICD-10-CM | POA: Diagnosis not present

## 2014-07-11 DIAGNOSIS — Z4823 Encounter for aftercare following liver transplant: Secondary | ICD-10-CM | POA: Diagnosis not present

## 2014-07-11 DIAGNOSIS — E872 Acidosis: Secondary | ICD-10-CM | POA: Diagnosis not present

## 2014-07-11 DIAGNOSIS — Z9483 Pancreas transplant status: Secondary | ICD-10-CM | POA: Diagnosis not present

## 2014-07-11 DIAGNOSIS — E11319 Type 2 diabetes mellitus with unspecified diabetic retinopathy without macular edema: Secondary | ICD-10-CM | POA: Diagnosis not present

## 2014-07-11 DIAGNOSIS — B9789 Other viral agents as the cause of diseases classified elsewhere: Secondary | ICD-10-CM | POA: Diagnosis not present

## 2014-07-11 DIAGNOSIS — Z5181 Encounter for therapeutic drug level monitoring: Secondary | ICD-10-CM | POA: Diagnosis not present

## 2014-07-11 DIAGNOSIS — I10 Essential (primary) hypertension: Secondary | ICD-10-CM | POA: Diagnosis not present

## 2014-07-11 DIAGNOSIS — E785 Hyperlipidemia, unspecified: Secondary | ICD-10-CM | POA: Diagnosis not present

## 2014-07-11 DIAGNOSIS — E782 Mixed hyperlipidemia: Secondary | ICD-10-CM | POA: Diagnosis not present

## 2014-07-16 ENCOUNTER — Ambulatory Visit (INDEPENDENT_AMBULATORY_CARE_PROVIDER_SITE_OTHER): Payer: Medicare Other | Admitting: Ophthalmology

## 2014-07-23 ENCOUNTER — Ambulatory Visit (INDEPENDENT_AMBULATORY_CARE_PROVIDER_SITE_OTHER): Payer: Medicare Other | Admitting: Ophthalmology

## 2014-08-15 ENCOUNTER — Ambulatory Visit (INDEPENDENT_AMBULATORY_CARE_PROVIDER_SITE_OTHER): Payer: Medicare Other | Admitting: Ophthalmology

## 2014-08-20 ENCOUNTER — Ambulatory Visit (INDEPENDENT_AMBULATORY_CARE_PROVIDER_SITE_OTHER): Payer: Medicare Other | Admitting: Ophthalmology

## 2014-08-20 DIAGNOSIS — H35033 Hypertensive retinopathy, bilateral: Secondary | ICD-10-CM | POA: Diagnosis not present

## 2014-08-20 DIAGNOSIS — E10319 Type 1 diabetes mellitus with unspecified diabetic retinopathy without macular edema: Secondary | ICD-10-CM

## 2014-08-20 DIAGNOSIS — E10359 Type 1 diabetes mellitus with proliferative diabetic retinopathy without macular edema: Secondary | ICD-10-CM | POA: Diagnosis not present

## 2014-08-20 DIAGNOSIS — I10 Essential (primary) hypertension: Secondary | ICD-10-CM

## 2014-08-20 DIAGNOSIS — H2513 Age-related nuclear cataract, bilateral: Secondary | ICD-10-CM | POA: Diagnosis not present

## 2014-08-20 DIAGNOSIS — H35373 Puckering of macula, bilateral: Secondary | ICD-10-CM | POA: Diagnosis not present

## 2014-09-07 ENCOUNTER — Ambulatory Visit (INDEPENDENT_AMBULATORY_CARE_PROVIDER_SITE_OTHER): Payer: Medicare Other | Admitting: Podiatry

## 2014-09-07 ENCOUNTER — Encounter: Payer: Self-pay | Admitting: Podiatry

## 2014-09-07 ENCOUNTER — Ambulatory Visit (INDEPENDENT_AMBULATORY_CARE_PROVIDER_SITE_OTHER): Payer: Medicare Other

## 2014-09-07 VITALS — BP 167/87 | HR 87 | Resp 15

## 2014-09-07 DIAGNOSIS — B351 Tinea unguium: Secondary | ICD-10-CM

## 2014-09-07 DIAGNOSIS — S92251G Displaced fracture of navicular [scaphoid] of right foot, subsequent encounter for fracture with delayed healing: Secondary | ICD-10-CM

## 2014-09-07 DIAGNOSIS — E1140 Type 2 diabetes mellitus with diabetic neuropathy, unspecified: Secondary | ICD-10-CM | POA: Diagnosis not present

## 2014-09-07 DIAGNOSIS — M14671 Charcot's joint, right ankle and foot: Secondary | ICD-10-CM

## 2014-09-07 DIAGNOSIS — E1149 Type 2 diabetes mellitus with other diabetic neurological complication: Secondary | ICD-10-CM

## 2014-09-09 ENCOUNTER — Encounter: Payer: Self-pay | Admitting: Podiatry

## 2014-09-09 NOTE — Progress Notes (Signed)
Patient ID: Derek Johnston, male   DOB: 05/07/1980, 34 y.o.   MRN: [id number]  Subjective: Derek Johnston returns the office today for follow-up evaluation of right foot Charcot as well as for elongated, thick toenails for which she is unable to trim himself. He states that [facility]. He denies any increase in swelling to the right side or any increase in warmth or any redness. Denies any recent injury or trauma. He also states that his nails are elongated and thickened for which she is unable to trim himself. Denies any systemic complaints such as fevers, chills, nausea, vomiting. No acute changes since last appointment, and no other complaints at this time.   Objective: AAO x3, NAD DP/PT pulses palpable bilaterally, CRT less than 3 seconds Protective sensation intact with Simms Weinstein monofilament, vibratory sensation intact, Achilles tendon reflex intact There is mild edema to the right foot compared to the left foot however it does appear to be improved compared to prior evaluation. There is no increase in warmth or erythema over the foot/ankle. There is no tenderness to palpation. There is no significant deformity present to the right foot however overlying the dorsal aspect of the midfoot on the talonavicular joint there is a palpable exostosis. No overlying skin changes at this time.  Nails are hypertrophic, dystrophic, elongated, brittle, discolored 10. There is no swelling erythema or drainage was nail sites. Subjectively, the patient states of the nails rubbed in the shoes causing irritation. No areas of pinpoint bony tenderness or pain with vibratory sensation. MMT 5/5, ROM WNL. No edema, erythema, increase in warmth to bilateral lower extremities.  No open lesions or pre-ulcerative lesions.  No pain with calf compression, swelling, warmth, erythema  Assessment: 34 year old male right foot Charcot, symptomatic onychomycosis, peripheral  neuropathy  Plan: -All treatment options discussed with the patient including all alternatives, risks, complications.  -X-rays were obtained and reviewed of the right foot. -Nail sharply debrided 10 without complications/bleeding. -Recommended to continue wearing the Crow walker at all times. Monitor for any increase in swelling, redness, warmth of the foot which are signs of acute Charcot. Discussed with him that if there is any signs to call the office immediately and begin nonweightbearing. Monitor for any skin breakdown. -Follow-up in 3 months or sooner if any problems are to arise or if there is any changes symptoms. -Patient encouraged to call the office with any questions, concerns, change in symptoms.

## 2014-10-24 DIAGNOSIS — N185 Chronic kidney disease, stage 5: Secondary | ICD-10-CM | POA: Diagnosis not present

## 2014-10-24 DIAGNOSIS — E785 Hyperlipidemia, unspecified: Secondary | ICD-10-CM | POA: Diagnosis not present

## 2014-10-24 DIAGNOSIS — R809 Proteinuria, unspecified: Secondary | ICD-10-CM | POA: Diagnosis not present

## 2014-10-24 DIAGNOSIS — N2581 Secondary hyperparathyroidism of renal origin: Secondary | ICD-10-CM | POA: Diagnosis not present

## 2014-10-24 DIAGNOSIS — D631 Anemia in chronic kidney disease: Secondary | ICD-10-CM | POA: Diagnosis not present

## 2014-10-24 DIAGNOSIS — N189 Chronic kidney disease, unspecified: Secondary | ICD-10-CM | POA: Diagnosis not present

## 2014-10-24 DIAGNOSIS — E1129 Type 2 diabetes mellitus with other diabetic kidney complication: Secondary | ICD-10-CM | POA: Diagnosis not present

## 2014-12-07 ENCOUNTER — Encounter: Payer: Self-pay | Admitting: Podiatry

## 2014-12-07 ENCOUNTER — Ambulatory Visit (INDEPENDENT_AMBULATORY_CARE_PROVIDER_SITE_OTHER): Payer: Medicare Other | Admitting: Podiatry

## 2014-12-07 VITALS — BP 141/81 | HR 80 | Resp 18

## 2014-12-07 DIAGNOSIS — M79676 Pain in unspecified toe(s): Secondary | ICD-10-CM

## 2014-12-07 DIAGNOSIS — B351 Tinea unguium: Secondary | ICD-10-CM

## 2014-12-07 DIAGNOSIS — E1140 Type 2 diabetes mellitus with diabetic neuropathy, unspecified: Secondary | ICD-10-CM

## 2014-12-07 DIAGNOSIS — E1149 Type 2 diabetes mellitus with other diabetic neurological complication: Secondary | ICD-10-CM

## 2014-12-13 ENCOUNTER — Encounter: Payer: Self-pay | Admitting: Podiatry

## 2014-12-13 NOTE — Progress Notes (Signed)
Patient ID: BLADEN UMAR, male   DOB: 10-31-1980, 34 y.o.   MRN: [id number]  Subjective: 34 y.o. returns the office today for painful, elongated, thickened toenails which he cannot trim himself. Denies any redness or drainage around the nails. Also states he's been wearing the CROW walker on the right side up until recently as it broke. He did drop it off at [facility] today. He denies any redness or increase in warmth to his foot he says the swelling has decreased although he does get some mild swelling. Denies any acute changes since last appointment and no new complaints today. Denies any systemic complaints such as fevers, chills, nausea, vomiting.   Objective: AAO 3, NAD DP/PT pulses palpable, CRT less than 3 seconds Protective sensation decreased with Simms Weinstein monofilament Nails hypertrophic, dystrophic, elongated, brittle, discolored 10. There is tenderness overlying the nails 1-5 bilaterally. There is no surrounding erythema or drainage along the nail sites. No open lesions or pre-ulcerative lesions are identified. There is mild edema to the right foot and ankle although appears to be stable and somewhat decreased compared last appointment. There is no increase in warmth or erythema compared to the contralateral extremity. No other areas of tenderness bilateral lower extremities. No overlying edema, erythema, increased warmth. No significant gross deformity bilaterally.  No pain with calf compression, swelling, warmth, erythema.  Assessment: Patient presents with symptomatic onychomycosis; Charcot  Plan: -Treatment options including alternatives, risks, complications were discussed -Nails sharply debrided 10 without complication/bleeding. -Continue with CROW walker -Monitor closely for any increase in warmth/redness/or increase in swelling. If there are any changes to call the office immediately.  -Discussed daily foot inspection. If there are any changes, to call the office  immediately.  -Follow-up in 3 months or sooner if any problems are to arise. In the meantime, encouraged to call the office with any questions, concerns, changes symptoms.  Ovid Curd, DPM

## 2015-02-19 ENCOUNTER — Ambulatory Visit (INDEPENDENT_AMBULATORY_CARE_PROVIDER_SITE_OTHER): Payer: Medicare Other | Admitting: Ophthalmology

## 2015-03-07 ENCOUNTER — Ambulatory Visit (INDEPENDENT_AMBULATORY_CARE_PROVIDER_SITE_OTHER): Payer: Medicare Other | Admitting: Ophthalmology

## 2015-03-08 ENCOUNTER — Ambulatory Visit: Payer: Medicare Other | Admitting: Podiatry

## 2015-04-08 ENCOUNTER — Ambulatory Visit: Payer: Medicare Other | Admitting: Podiatry

## 2015-04-17 DIAGNOSIS — N189 Chronic kidney disease, unspecified: Secondary | ICD-10-CM | POA: Diagnosis not present

## 2015-04-17 DIAGNOSIS — Z9483 Pancreas transplant status: Secondary | ICD-10-CM | POA: Diagnosis not present

## 2015-04-17 DIAGNOSIS — E1129 Type 2 diabetes mellitus with other diabetic kidney complication: Secondary | ICD-10-CM | POA: Diagnosis not present

## 2015-04-17 DIAGNOSIS — N185 Chronic kidney disease, stage 5: Secondary | ICD-10-CM | POA: Diagnosis not present

## 2015-04-17 DIAGNOSIS — Z940 Kidney transplant status: Secondary | ICD-10-CM | POA: Diagnosis not present

## 2015-04-17 DIAGNOSIS — E785 Hyperlipidemia, unspecified: Secondary | ICD-10-CM | POA: Diagnosis not present

## 2015-04-17 DIAGNOSIS — N2581 Secondary hyperparathyroidism of renal origin: Secondary | ICD-10-CM | POA: Diagnosis not present

## 2015-04-17 DIAGNOSIS — R809 Proteinuria, unspecified: Secondary | ICD-10-CM | POA: Diagnosis not present

## 2015-04-17 DIAGNOSIS — D631 Anemia in chronic kidney disease: Secondary | ICD-10-CM | POA: Diagnosis not present

## 2015-04-17 DIAGNOSIS — G629 Polyneuropathy, unspecified: Secondary | ICD-10-CM | POA: Diagnosis not present

## 2015-06-24 DIAGNOSIS — E1022 Type 1 diabetes mellitus with diabetic chronic kidney disease: Secondary | ICD-10-CM | POA: Diagnosis not present

## 2015-06-24 DIAGNOSIS — B9789 Other viral agents as the cause of diseases classified elsewhere: Secondary | ICD-10-CM | POA: Diagnosis not present

## 2015-06-24 DIAGNOSIS — Z794 Long term (current) use of insulin: Secondary | ICD-10-CM | POA: Diagnosis not present

## 2015-06-24 DIAGNOSIS — D899 Disorder involving the immune mechanism, unspecified: Secondary | ICD-10-CM | POA: Diagnosis not present

## 2015-06-24 DIAGNOSIS — N186 End stage renal disease: Secondary | ICD-10-CM | POA: Diagnosis not present

## 2015-06-24 DIAGNOSIS — Z940 Kidney transplant status: Secondary | ICD-10-CM | POA: Diagnosis not present

## 2015-06-24 DIAGNOSIS — I10 Essential (primary) hypertension: Secondary | ICD-10-CM | POA: Diagnosis not present

## 2015-06-24 DIAGNOSIS — I120 Hypertensive chronic kidney disease with stage 5 chronic kidney disease or end stage renal disease: Secondary | ICD-10-CM | POA: Diagnosis not present

## 2015-06-24 DIAGNOSIS — Z9483 Pancreas transplant status: Secondary | ICD-10-CM | POA: Diagnosis not present

## 2015-06-24 DIAGNOSIS — Z7982 Long term (current) use of aspirin: Secondary | ICD-10-CM | POA: Diagnosis not present

## 2015-06-24 DIAGNOSIS — Z79899 Other long term (current) drug therapy: Secondary | ICD-10-CM | POA: Diagnosis not present

## 2015-06-24 DIAGNOSIS — E872 Acidosis: Secondary | ICD-10-CM | POA: Diagnosis not present

## 2015-06-24 DIAGNOSIS — Z4822 Encounter for aftercare following kidney transplant: Secondary | ICD-10-CM | POA: Diagnosis not present

## 2015-06-24 DIAGNOSIS — E669 Obesity, unspecified: Secondary | ICD-10-CM | POA: Diagnosis not present

## 2015-06-24 IMAGING — CT CT FOOT*R* W/O CM
1 of 7 series · 3 of 14 positions shown, 4 images · non-contrast
Comparison: Radiographs dated 12/06/2013

CLINICAL DATA: Pain and swelling at the medial aspect of the ankle
and the anterior midfoot after blunt trauma 10 weeks ago. Navicular
fracture.

EXAM:
CT OF THE RIGHT FOOT WITHOUT CONTRAST; CT OF THE RIGHT ANKLE WITHOUT
CONTRAST
TECHNIQUE: Multidetector CT imaging was performed according to the standard
protocol. Multiplanar CT image reconstructions were also generated.

[Series 401: cor bone · axial · 0.55mm/px · z∈[-30,+110]mm · 3 of 138 slices shown, 4 images]
[im 1/138  soft-tissue]
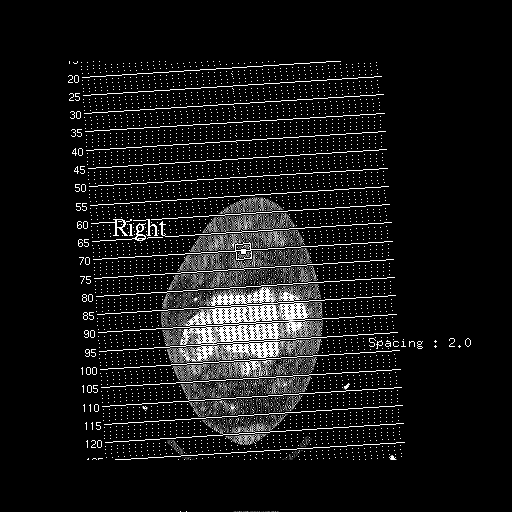
[im 1/138  bone]
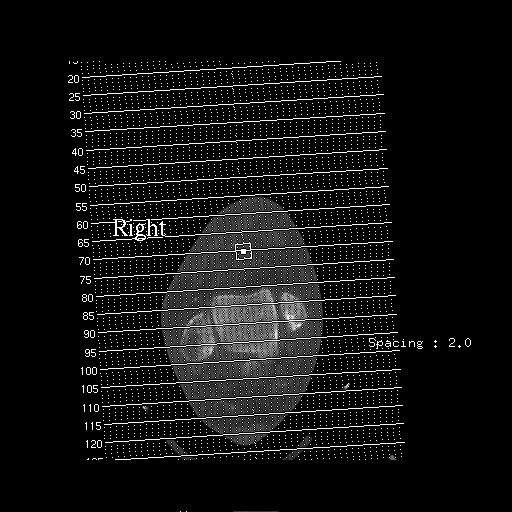
[im 69/138  bone]
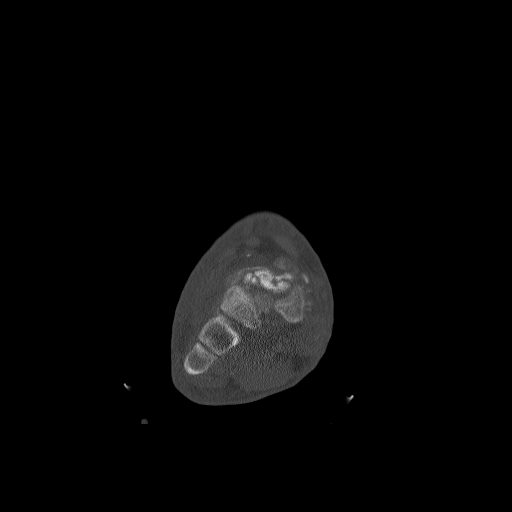
[im 138/138  bone]
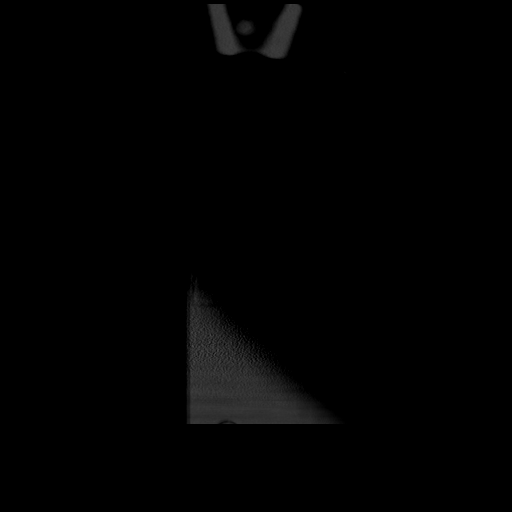

[3 of 14 positions shown; findings below may reference images not displayed]

FINDINGS: CT SCAN OF THE RIGHT ANKLE:

The distal tibia and fibula and talus are intact. Prominent os
trigonum is a normal variant. There is slight circumferential edema
around the ankle, most prominent medially. The tendons around the
ankle are normal.

There is extensive arterial vascular calcification suggesting
diabetes. The medial and lateral ankle ligaments appear intact.
Achilles tendon is normal. No ankle joint effusion.

CT SCAN OF THE FOOT:

There is a markedly fragmented fracture of the navicular with
impaction. The bone fragments are sclerotic indicating this is not
acute. This could be seen with early Charcot foot. Does the patient
have diabetic neuropathy?

There is a slight impaction fracture of the distal aspect of the
lateral cuneiform. There is a tiny avulsion from the dorsal distal
lateral aspect of the cuboid.

The talus and calcaneus and the metatarsals are intact. Phalangeal
bones are normal.
IMPRESSION: 1. No acute osseous abnormality of the ankle. Slight soft tissue
swelling around the ankle joint, nonspecific.
2. Collapse and fragmentation of the navicular with sclerosis.
Slight impaction fracture of the distal lateral cuneiform. These
could both the represents subacute traumatic injuries. Charcot foot
could also give this appearance.

## 2015-07-19 ENCOUNTER — Encounter: Payer: Self-pay | Admitting: Podiatry

## 2015-07-19 ENCOUNTER — Ambulatory Visit (INDEPENDENT_AMBULATORY_CARE_PROVIDER_SITE_OTHER): Payer: Medicare Other | Admitting: Podiatry

## 2015-07-19 ENCOUNTER — Ambulatory Visit (INDEPENDENT_AMBULATORY_CARE_PROVIDER_SITE_OTHER): Payer: Medicare Other

## 2015-07-19 VITALS — BP 114/49 | HR 84 | Resp 12

## 2015-07-19 DIAGNOSIS — B351 Tinea unguium: Secondary | ICD-10-CM | POA: Diagnosis not present

## 2015-07-19 DIAGNOSIS — M79676 Pain in unspecified toe(s): Secondary | ICD-10-CM

## 2015-07-19 DIAGNOSIS — M14671 Charcot's joint, right ankle and foot: Secondary | ICD-10-CM

## 2015-07-19 DIAGNOSIS — E1149 Type 2 diabetes mellitus with other diabetic neurological complication: Secondary | ICD-10-CM

## 2015-07-19 DIAGNOSIS — R609 Edema, unspecified: Secondary | ICD-10-CM | POA: Diagnosis not present

## 2015-07-19 NOTE — Progress Notes (Signed)
Patient ID: Derek RedbirdDewayne E Johnston, male   DOB: 02/05/1981, 35 y.o.   MRN: [id number]  Subjective: 35 y.o. returns the office today for painful, elongated, thickened toenails which he cannot trim himself. Denies any redness or drainage around the nails. States that he is doing a lot of walking at work. He has not been wearing the CROW walker. Denies any recent injury or trauma. No open sores. His right foot is still somewhat swollen but it is improving and that what it was. No warmth. No redness. Denies any acute changes since last appointment and no new complaints today. Denies any systemic complaints such as fevers, chills, nausea, vomiting.   Objective: AAO 3, NAD DP/PT pulses palpable, CRT less than 3 seconds Protective sensation decreased with Simms Weinstein monofilament Nails hypertrophic, dystrophic, elongated, brittle, discolored 10. There is tenderness overlying the nails 1-5 bilaterally. There is no surrounding erythema or drainage along the nail sites. No open lesions or pre-ulcerative lesions are identified. There is mild edema to the right foot and ankle although appears to be stable. There is no increase in warmth or erythema compared to the contralateral extremity. The right foot appears to be somewhat wider compared to the left side and there is a mild flatfoot on the right side however there is no other significant deformity. No other areas of tenderness bilateral lower extremities. No overlying edema, erythema, increased warmth. No significant gross deformity bilaterally.  No pain with calf compression, swelling, warmth, erythema.  Assessment: Patient presents with symptomatic onychomycosis; Charcot  Plan: -X-rays were obtained and reviewed with the patient. Arthritic changes the talonavicular joint however appears stable compared to previous x-rays. -Treatment options including alternatives, risks, complications were discussed -Nails sharply debrided 10 without  complication/bleeding. -I believe that he would likely benefit from diabetic shoes given the Charcot and neuropathy. A bur was completed today for precertification. -Monitor closely for any increase in warmth/redness/or increase in swelling. If there are any changes to call the office immediately.  -Discussed daily foot inspection. If there are any changes, to call the office immediately.  -Follow-up in 3 months or sooner if any problems are to arise. In the meantime, encouraged to call the office with any questions, concerns, changes symptoms.  Ovid CurdMatthew Wagoner, DPM

## 2015-08-09 ENCOUNTER — Ambulatory Visit (INDEPENDENT_AMBULATORY_CARE_PROVIDER_SITE_OTHER): Payer: Medicare Other | Admitting: Podiatry

## 2015-08-09 ENCOUNTER — Encounter: Payer: Self-pay | Admitting: Podiatry

## 2015-08-09 ENCOUNTER — Ambulatory Visit (INDEPENDENT_AMBULATORY_CARE_PROVIDER_SITE_OTHER): Payer: Medicare Other

## 2015-08-09 VITALS — BP 132/80 | HR 88 | Resp 16

## 2015-08-09 DIAGNOSIS — M14671 Charcot's joint, right ankle and foot: Secondary | ICD-10-CM | POA: Diagnosis not present

## 2015-08-09 DIAGNOSIS — R52 Pain, unspecified: Secondary | ICD-10-CM

## 2015-08-09 DIAGNOSIS — M722 Plantar fascial fibromatosis: Secondary | ICD-10-CM | POA: Diagnosis not present

## 2015-08-09 MED ORDER — TRIAMCINOLONE ACETONIDE 10 MG/ML IJ SUSP
10.0000 mg | Freq: Once | INTRAMUSCULAR | Status: AC
  Administered 2015-08-09: 10 mg

## 2015-08-11 NOTE — Progress Notes (Signed)
Subjective:     Patient ID: Derek Johnston, male   DOB: 06/16/1980, 35 y.o.   MRN: [id number]  HPI patient states I developed a lot of pain on top of my right foot with no history of injury. I did break in a number of years ago but that was not contributory to what I'm feeling now   Review of Systems     Objective:   Physical Exam Neurovascular status intact no change in health history with patient found to have discomfort in the dorsum of the right foot with inflammation noted. It is localized    Assessment:     Inflammatory tendinitis right with no indications of current fracture    Plan:     H&P and x-rays reviewed. Today I went ahead did a careful injection right 3 mg Kenalog 5 mill grams Xylocaine and advised on ice therapy wider shoes and will be seen back as symptoms indicate  X-rays were negative for indications of current fracture with mild arthritic find

## 2015-08-22 DIAGNOSIS — M79671 Pain in right foot: Secondary | ICD-10-CM | POA: Diagnosis not present

## 2015-08-22 DIAGNOSIS — S92251P Displaced fracture of navicular [scaphoid] of right foot, subsequent encounter for fracture with malunion: Secondary | ICD-10-CM | POA: Diagnosis not present

## 2015-08-26 DIAGNOSIS — E1129 Type 2 diabetes mellitus with other diabetic kidney complication: Secondary | ICD-10-CM | POA: Diagnosis not present

## 2015-08-26 DIAGNOSIS — Z9483 Pancreas transplant status: Secondary | ICD-10-CM | POA: Diagnosis not present

## 2015-08-26 DIAGNOSIS — Z940 Kidney transplant status: Secondary | ICD-10-CM | POA: Diagnosis not present

## 2015-08-26 DIAGNOSIS — G629 Polyneuropathy, unspecified: Secondary | ICD-10-CM | POA: Diagnosis not present

## 2015-08-26 DIAGNOSIS — E785 Hyperlipidemia, unspecified: Secondary | ICD-10-CM | POA: Diagnosis not present

## 2015-08-26 DIAGNOSIS — D631 Anemia in chronic kidney disease: Secondary | ICD-10-CM | POA: Diagnosis not present

## 2015-08-26 DIAGNOSIS — N185 Chronic kidney disease, stage 5: Secondary | ICD-10-CM | POA: Diagnosis not present

## 2015-08-26 DIAGNOSIS — N2581 Secondary hyperparathyroidism of renal origin: Secondary | ICD-10-CM | POA: Diagnosis not present

## 2015-08-26 DIAGNOSIS — N189 Chronic kidney disease, unspecified: Secondary | ICD-10-CM | POA: Diagnosis not present

## 2015-08-26 DIAGNOSIS — R809 Proteinuria, unspecified: Secondary | ICD-10-CM | POA: Diagnosis not present

## 2015-10-18 ENCOUNTER — Ambulatory Visit: Payer: Medicare Other | Admitting: Podiatry

## 2015-10-25 ENCOUNTER — Ambulatory Visit (INDEPENDENT_AMBULATORY_CARE_PROVIDER_SITE_OTHER): Payer: Medicare Other | Admitting: Podiatry

## 2015-10-25 ENCOUNTER — Encounter: Payer: Self-pay | Admitting: Podiatry

## 2015-10-25 DIAGNOSIS — B351 Tinea unguium: Secondary | ICD-10-CM

## 2015-10-25 DIAGNOSIS — M79676 Pain in unspecified toe(s): Secondary | ICD-10-CM | POA: Diagnosis not present

## 2015-10-27 NOTE — Progress Notes (Signed)
Patient ID: Derek RedbirdDewayne E Gaster, male   DOB: 11/14/1980, 35 y.o.   MRN: [id number]  Subjective: 35 y.o. returns the office today for painful, elongated, thickened toenails which he cannot trim himself. Denies any redness or drainage around the nails. He continues to wear the CROW walker while at work he states the swelling has improved is. Denies any redness or warmth to his foot. Denies any acute changes since last appointment and no new complaints today. Denies any systemic complaints such as fevers, chills, nausea, vomiting.   Objective: AAO 3, NAD DP/PT pulses palpable, CRT less than 3 seconds Protective sensation decreased with Simms Weinstein monofilament Nails hypertrophic, dystrophic, elongated, brittle, discolored 10. There is tenderness overlying the nails 1-5 bilaterally. There is no surrounding erythema or drainage along the nail sites. No open lesions or pre-ulcerative lesions are identified. There is mild but improved edema to the right foot and ankle.There is no increase in warmth or erythema compared to the contralateral extremity. The right foot appears to be somewhat wider compared to the left side and there is a mild flatfoot on the right side however there is no other significant deformity. This is unchanged compared to previous appointments. No other areas of tenderness bilateral lower extremities. No overlying edema, erythema, increased warmth. No significant gross deformity bilaterally.  No pain with calf compression, swelling, warmth, erythema.  Assessment: Patient presents with symptomatic onychomycosis  Plan: -Treatment options discussed including all alternatives, risks, and complications -Etiology of symptoms were discussed -Nails debrided 10 without complications or bleeding. -Daily foot inspection -Continue CROW. Eventually will make a custom AFO brace.  -Follow-up in 3 months or sooner if any problems arise. In the meantime, encouraged to call the office with any  questions, concerns, change in symptoms.   Ovid CurdMatthew Wagoner, DPM

## 2016-01-22 ENCOUNTER — Emergency Department (HOSPITAL_COMMUNITY): Payer: BLUE CROSS/BLUE SHIELD

## 2016-01-22 ENCOUNTER — Encounter (HOSPITAL_COMMUNITY): Payer: Self-pay | Admitting: Emergency Medicine

## 2016-01-22 ENCOUNTER — Emergency Department (HOSPITAL_COMMUNITY)
Admission: EM | Admit: 2016-01-22 | Discharge: 2016-01-22 | Disposition: A | Discharge status: Other Acute Inpatient Hospital | Payer: BLUE CROSS/BLUE SHIELD | Attending: Emergency Medicine | Admitting: Emergency Medicine

## 2016-01-22 ENCOUNTER — Other Ambulatory Visit: Payer: Self-pay | Admitting: Oncology

## 2016-01-22 DIAGNOSIS — E11319 Type 2 diabetes mellitus with unspecified diabetic retinopathy without macular edema: Secondary | ICD-10-CM | POA: Diagnosis not present

## 2016-01-22 DIAGNOSIS — D899 Disorder involving the immune mechanism, unspecified: Secondary | ICD-10-CM | POA: Diagnosis not present

## 2016-01-22 DIAGNOSIS — Z7982 Long term (current) use of aspirin: Secondary | ICD-10-CM | POA: Diagnosis not present

## 2016-01-22 DIAGNOSIS — R059 Cough, unspecified: Secondary | ICD-10-CM

## 2016-01-22 DIAGNOSIS — I120 Hypertensive chronic kidney disease with stage 5 chronic kidney disease or end stage renal disease: Secondary | ICD-10-CM | POA: Diagnosis not present

## 2016-01-22 DIAGNOSIS — R509 Fever, unspecified: Secondary | ICD-10-CM | POA: Diagnosis present

## 2016-01-22 DIAGNOSIS — R05 Cough: Secondary | ICD-10-CM | POA: Diagnosis not present

## 2016-01-22 DIAGNOSIS — N186 End stage renal disease: Secondary | ICD-10-CM | POA: Diagnosis not present

## 2016-01-22 DIAGNOSIS — N184 Chronic kidney disease, stage 4 (severe): Secondary | ICD-10-CM | POA: Diagnosis not present

## 2016-01-22 DIAGNOSIS — E1140 Type 2 diabetes mellitus with diabetic neuropathy, unspecified: Secondary | ICD-10-CM | POA: Diagnosis not present

## 2016-01-22 DIAGNOSIS — I129 Hypertensive chronic kidney disease with stage 1 through stage 4 chronic kidney disease, or unspecified chronic kidney disease: Secondary | ICD-10-CM | POA: Insufficient documentation

## 2016-01-22 DIAGNOSIS — Z79899 Other long term (current) drug therapy: Secondary | ICD-10-CM | POA: Diagnosis not present

## 2016-01-22 DIAGNOSIS — E782 Mixed hyperlipidemia: Secondary | ICD-10-CM | POA: Diagnosis present

## 2016-01-22 DIAGNOSIS — D708 Other neutropenia: Secondary | ICD-10-CM

## 2016-01-22 DIAGNOSIS — D709 Neutropenia, unspecified: Secondary | ICD-10-CM | POA: Insufficient documentation

## 2016-01-22 DIAGNOSIS — E1122 Type 2 diabetes mellitus with diabetic chronic kidney disease: Secondary | ICD-10-CM | POA: Diagnosis not present

## 2016-01-22 DIAGNOSIS — B9789 Other viral agents as the cause of diseases classified elsewhere: Secondary | ICD-10-CM | POA: Diagnosis not present

## 2016-01-22 DIAGNOSIS — R5081 Fever presenting with conditions classified elsewhere: Secondary | ICD-10-CM | POA: Diagnosis not present

## 2016-01-22 DIAGNOSIS — E109 Type 1 diabetes mellitus without complications: Secondary | ICD-10-CM | POA: Diagnosis present

## 2016-01-22 DIAGNOSIS — Z9483 Pancreas transplant status: Secondary | ICD-10-CM | POA: Diagnosis not present

## 2016-01-22 DIAGNOSIS — R918 Other nonspecific abnormal finding of lung field: Secondary | ICD-10-CM | POA: Diagnosis not present

## 2016-01-22 DIAGNOSIS — I10 Essential (primary) hypertension: Secondary | ICD-10-CM | POA: Diagnosis present

## 2016-01-22 DIAGNOSIS — E785 Hyperlipidemia, unspecified: Secondary | ICD-10-CM | POA: Diagnosis not present

## 2016-01-22 DIAGNOSIS — B9719 Other enterovirus as the cause of diseases classified elsewhere: Secondary | ICD-10-CM | POA: Diagnosis not present

## 2016-01-22 DIAGNOSIS — B348 Other viral infections of unspecified site: Secondary | ICD-10-CM | POA: Diagnosis present

## 2016-01-22 DIAGNOSIS — J189 Pneumonia, unspecified organism: Secondary | ICD-10-CM | POA: Diagnosis present

## 2016-01-22 DIAGNOSIS — Z940 Kidney transplant status: Secondary | ICD-10-CM | POA: Diagnosis not present

## 2016-01-22 LAB — COMPREHENSIVE METABOLIC PANEL
ALT: 21 U/L (ref 17–63)
AST: 17 U/L (ref 15–41)
Albumin: 3.9 g/dL (ref 3.5–5.0)
Alkaline Phosphatase: 68 U/L (ref 38–126)
Anion gap: 9 (ref 5–15)
BILIRUBIN TOTAL: 0.8 mg/dL (ref 0.3–1.2)
BUN: 16 mg/dL (ref 6–20)
CO2: 25 mmol/L (ref 22–32)
Calcium: 9.7 mg/dL (ref 8.9–10.3)
Chloride: 100 mmol/L — ABNORMAL LOW (ref 101–111)
Creatinine, Ser: 1.37 mg/dL — ABNORMAL HIGH (ref 0.61–1.24)
Glucose, Bld: 119 mg/dL — ABNORMAL HIGH (ref 65–99)
POTASSIUM: 4.3 mmol/L (ref 3.5–5.1)
Sodium: 134 mmol/L — ABNORMAL LOW (ref 135–145)
TOTAL PROTEIN: 6.9 g/dL (ref 6.5–8.1)

## 2016-01-22 LAB — URINALYSIS, ROUTINE W REFLEX MICROSCOPIC
BILIRUBIN URINE: NEGATIVE
Glucose, UA: NEGATIVE mg/dL
HGB URINE DIPSTICK: NEGATIVE
Ketones, ur: NEGATIVE mg/dL
Leukocytes, UA: NEGATIVE
Nitrite: NEGATIVE
PH: 7 (ref 5.0–8.0)
Protein, ur: 30 mg/dL — AB
SPECIFIC GRAVITY, URINE: 1.018 (ref 1.005–1.030)

## 2016-01-22 LAB — CBC WITH DIFFERENTIAL/PLATELET
BASOS ABS: 0 10*3/uL (ref 0.0–0.1)
Basophils Relative: 1 %
Eosinophils Absolute: 0.2 10*3/uL (ref 0.0–0.7)
Eosinophils Relative: 5 %
HCT: 46.2 % (ref 39.0–52.0)
Hemoglobin: 14.7 g/dL (ref 13.0–17.0)
LYMPHS ABS: 0.8 10*3/uL (ref 0.7–4.0)
Lymphocytes Relative: 25 %
MCH: 26.3 pg (ref 26.0–34.0)
MCHC: 31.8 g/dL (ref 30.0–36.0)
MCV: 82.6 fL (ref 78.0–100.0)
MONO ABS: 1.9 10*3/uL — AB (ref 0.1–1.0)
MONOS PCT: 65 %
Neutro Abs: 0.1 10*3/uL — ABNORMAL LOW (ref 1.7–7.7)
Neutrophils Relative %: 4 %
Platelets: 309 10*3/uL (ref 150–400)
RBC: 5.59 MIL/uL (ref 4.22–5.81)
RDW: 14.1 % (ref 11.5–15.5)
WBC: 3 10*3/uL — AB (ref 4.0–10.5)

## 2016-01-22 LAB — I-STAT CHEM 8, ED
BUN: 24 mg/dL — ABNORMAL HIGH (ref 6–20)
CALCIUM ION: 1.11 mmol/L — AB (ref 1.15–1.40)
Chloride: 100 mmol/L — ABNORMAL LOW (ref 101–111)
Creatinine, Ser: 1.3 mg/dL — ABNORMAL HIGH (ref 0.61–1.24)
GLUCOSE: 131 mg/dL — AB (ref 65–99)
HCT: 48 % (ref 39.0–52.0)
HEMOGLOBIN: 16.3 g/dL (ref 13.0–17.0)
Potassium: 5.3 mmol/L — ABNORMAL HIGH (ref 3.5–5.1)
Sodium: 135 mmol/L (ref 135–145)
TCO2: 28 mmol/L (ref 0–100)

## 2016-01-22 LAB — URINE MICROSCOPIC-ADD ON
Bacteria, UA: NONE SEEN
Squamous Epithelial / LPF: NONE SEEN
WBC UA: NONE SEEN WBC/hpf (ref 0–5)

## 2016-01-22 LAB — I-STAT CG4 LACTIC ACID, ED: LACTIC ACID, VENOUS: 1.14 mmol/L (ref 0.5–1.9)

## 2016-01-22 LAB — INFLUENZA PANEL BY PCR (TYPE A & B)
H1N1 flu by pcr: NOT DETECTED
INFLAPCR: NEGATIVE
Influenza B By PCR: NEGATIVE

## 2016-01-22 LAB — RAPID STREP SCREEN (MED CTR MEBANE ONLY): STREPTOCOCCUS, GROUP A SCREEN (DIRECT): NEGATIVE

## 2016-01-22 MED ORDER — ACETAMINOPHEN 325 MG PO TABS
650.0000 mg | ORAL_TABLET | Freq: Once | ORAL | Status: AC
  Administered 2016-01-22: 650 mg via ORAL
  Filled 2016-01-22: qty 2

## 2016-01-22 MED ORDER — DEXTROSE 5 % IV SOLN
2.0000 g | Freq: Three times a day (TID) | INTRAVENOUS | Status: DC
  Administered 2016-01-22: 2 g via INTRAVENOUS
  Filled 2016-01-22: qty 2

## 2016-01-22 MED ORDER — DEXTROSE 5 % IV SOLN
2.0000 g | Freq: Once | INTRAVENOUS | Status: AC
  Administered 2016-01-22: 2 g via INTRAVENOUS
  Filled 2016-01-22: qty 2

## 2016-01-22 MED ORDER — VANCOMYCIN HCL IN DEXTROSE 1-5 GM/200ML-% IV SOLN: 1000.0000 mg | Freq: Once | INTRAVENOUS | Status: DC

## 2016-01-22 MED ORDER — SODIUM CHLORIDE 0.9 % IV SOLN
2000.0000 mg | Freq: Once | INTRAVENOUS | Status: AC
  Administered 2016-01-22: 2000 mg via INTRAVENOUS
  Filled 2016-01-22: qty 2000

## 2016-01-22 MED ORDER — VANCOMYCIN HCL IN DEXTROSE 1-5 GM/200ML-% IV SOLN: 1000.0000 mg | Freq: Two times a day (BID) | INTRAVENOUS | Status: DC

## 2016-01-22 MED ORDER — OSELTAMIVIR PHOSPHATE 75 MG PO CAPS
75.0000 mg | ORAL_CAPSULE | Freq: Once | ORAL | Status: AC
  Administered 2016-01-22: 75 mg via ORAL
  Filled 2016-01-22: qty 1

## 2016-01-22 NOTE — ED Triage Notes (Addendum)
Pt c/o chills, fever, sore throat, cold symptoms -- started 2 nights ago. Has taken OTC meds without relief, no tylenol or ibuprofen today.   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

## 2016-01-22 NOTE — ED Notes (Signed)
[facility]

## 2016-01-22 NOTE — ED Notes (Signed)
Patient is sitting on the side of the stretcher watching television; waiting for a bed; no needs at this time

## 2016-01-22 NOTE — ED Notes (Signed)
Pt states that he has been drinking ice water at this time. Pt's temperature rechecked via Axillary route at this time.  Temp noted to be 99.0 f.

## 2016-01-22 NOTE — Progress Notes (Signed)
Pharmacy Antibiotic Note  Derek Johnston is a 35 y.o. male admitted on 01/22/2016 with chills, fevers, sore throat, and cold symptoms.. 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  Pharmacy has been consulted for antibiotic dosing for febrile neutropenia with SCr of 1.3, ANC 100, WBC 3.0, and Tmax 101.7. Marland Kitchen.   Plan: Initiate loading dose of vancomycin 2000 mg IV once. Initiate vancomycin 1000 mg IV every 12 hrs.   Goal trough 15-20 mcg/mL.  Initiate Cefepime 2 g IV every 8 hrs.   Weight: 240 lb (108.9 kg)  Temp (24hrs), Avg:100.6 F (38.1 C), Min:99.8 F (37.7 C), Max:101.7 F (38.7 C)   Recent Labs Lab 01/22/16 0847 01/22/16 0852 01/22/16 0853  WBC 3.0*  --   --   CREATININE 1.37* 1.30*  --   LATICACIDVEN  --   --  1.14    CrCl cannot be calculated (Unknown ideal weight.).   Calculated CrCl = 92.82 mL/min with IBW 82.75 kg.   No Known Allergies  Antimicrobials this admission: 9/20 Vancomycin >> 9/20 Cefepime >>  Dose adjustments this admission: N/A  Microbiology results: Rapid strep screen: Negative Group A strep Culture: Pending Blood Culture: Pending Urine Culture: Pending  Thank you for allowing pharmacy to be a part of this patient's care.  Derek Johnston 01/22/2016 1:42 PM

## 2016-01-22 NOTE — ED Notes (Signed)
Ok for pt to take home BP meds per Moselle[place]ie, [place]

## 2016-01-22 NOTE — ED Notes (Signed)
[facility] unable to transport pt of this time. [facility] aware [facility]. Pt and family updated.

## 2016-01-22 NOTE — ED Notes (Signed)
Ordered diet tray 

## 2016-01-22 NOTE — ED Notes (Signed)
Contacted [facility] - still awaiting bed at facility. [facility] will call when bed is available.

## 2016-01-22 NOTE — ED Notes (Signed)
Patient made aware of need of urine specimen; urinal placed at bedside and patient stated he will use when he can go

## 2016-01-22 NOTE — ED Provider Notes (Signed)
Medical screening examination/treatment/procedure(s) were conducted as a shared visit with non-physician practitioner(s) and myself.  I personally evaluated the patient during the encounter. Briefly, the patient is a 35 y.o. male with a history of pancreatic and renal transplants currently on immunosuppression presents to ED with several days of fever, URI symptoms, and malaise. No evidence of sepsis. CBC with Neutropenia. No source of infection identified. Pt will require admission for neutropenic fever; [facility]. [facility] team who recommended admission to Nephro service.   EKG Interpretation  Date/Time:  Wednesday January 22 2016 09:26:18 EDT Ventricular Rate:  96 PR Interval:    QRS Duration: 80 QT Interval:  313 QTC Calculation: 396 R Axis:   25 Text Interpretation:  Sinus rhythm Ventricular premature complex Biatrial enlargement ST elev, probable normal early repol pattern Confirmed by [facility], PEDRO ([id number]) on 01/22/2016 9:36:00 AM           Nira ConnPedro Eduardo Cardama, MD 01/23/16 1021

## 2016-01-22 NOTE — ED Notes (Signed)
Contacted [facility] re: pt transfer to renal ward. No available bed for pt at this time, will be contacted when bed is available.

## 2016-01-22 NOTE — ED Notes (Signed)
[facility] RN with [facility] at this time.  Pt in no apparent distress at this time.  Continue pt's PIV and cardiac monitor upon [facility] [phone].

## 2016-01-22 NOTE — ED Provider Notes (Signed)
[facility]-EMERGENCY DEPT Provider Note   CSN: [id number] Arrival date & time: 01/22/16  0806    History   Chief Complaint Chief Complaint  Patient presents with  . Generalized Body Aches  . Sore Throat  . Fever    HPI Chrisotpher E Marlana LatusFoye is a 35 y.o. male.  The history is provided by the patient and medical records. No language interpreter was used.   Bernette RedbirdDewayne E Hiegel is a 35 y.o. male  with a PMH of kidney and pancreatic transplant four years ago currently on anti-rejection medications which he is taking as directed who presents to the Emergency Department complaining of productive cough x 3 days. Associated symptoms include nasal congestion, sore throat, body aches and chills. Did not check temperature at home but is febrile 101.7 in triage. 1-2 non-bloody loose stools x 3 days as well. No alleviating or aggravating factors noted. No chest pain, shortness of breath, neck stiffness, abdominal pain, n/v,     Past Medical History:  Diagnosis Date  . Anemia   . Chronic kidney disease    stage 4   . Diabetes mellitus   . Dyslipidemia   . Hyperlipidemia   . Peripheral neuropathy (HCC)   . Retinopathy   . Swelling of extremity     Patient Active Problem List   Diagnosis Date Noted  . Charcot foot due to diabetes mellitus (HCC) 02/03/2014  . HYPERCHOLESTEROLEMIA 04/22/2007  . HYPERKALEMIA 04/22/2007  . DIABETES MELLITUS, TYPE I 01/14/2007  . HYPERTENSION 01/14/2007    Past Surgical History:  Procedure Laterality Date  . ARTERIOVENOUS GRAFT PLACEMENT    . AV FISTULA PLACEMENT  04/08/2011   Procedure: ARTERIOVENOUS (AV) FISTULA CREATION;  Surgeon: Sherren Kernsharles E Fields, MD;  [facility];  Service: Vascular;  Laterality: Left;  Creation of left upper arm arteriovenous fistula  . EYE SURGERY     Bilateral diabetic retina problems  . SHUNTOGRAM N/A 10/22/2011   Procedure: Betsey AmenSHUNTOGRAM;  Surgeon: Fransisco HertzBrian L Chen, MD;  [facility];  Service: Cardiovascular;  Laterality: N/A;        Home Medications    Prior to Admission medications   Medication Sig Start Date End Date Taking? Authorizing Provider  amLODipine (NORVASC) 10 MG tablet Take 10 mg by mouth daily.   Yes Historical Provider, MD  aspirin 81 MG chewable tablet Chew 81 mg by mouth. 01/06/12  Yes Historical Provider, MD  atorvastatin (LIPITOR) 10 MG tablet Take 10 mg by mouth daily at 6 PM.  03/20/14  Yes Historical Provider, MD  Multiple Vitamin (MULTIVITAMIN WITH MINERALS) TABS tablet Take 1 tablet by mouth daily.   Yes Historical Provider, MD  mycophenolate (MYFORTIC) 180 MG EC tablet Take 720 mg by mouth 2 (two) times daily.    Yes Historical Provider, MD  predniSONE (DELTASONE) 5 MG tablet Take 5 mg by mouth daily with breakfast.   Yes Historical Provider, MD  PROGRAF 1 MG capsule Take 4-5 mg by mouth 2 (two) times daily. Takes 5mg  in the morning and 4mg  in the evening 03/05/14  Yes Historical Provider, MD  sulfamethoxazole-trimethoprim (BACTRIM) 400-80 MG per tablet Take 1 tablet by mouth 3 (three) times a week.    Yes Historical Provider, MD    Family History Family History  Problem Relation Age of Onset  . Hypertension Mother   . Kidney disease Maternal Aunt   . Kidney disease Maternal Grandmother     Social History Social History  Substance Use Topics  . Smoking status: Never  Smoker  . Smokeless tobacco: Never Used  . Alcohol use No     Allergies   Review of patient's allergies indicates no known allergies.   Review of Systems Review of Systems  Constitutional: Positive for chills and fever (In triage, did not check at home).  HENT: Positive for congestion and sore throat. Negative for trouble swallowing.   Eyes: Negative for visual disturbance.  Respiratory: Positive for cough. Negative for shortness of breath.   Cardiovascular: Negative.   Gastrointestinal: Positive for diarrhea. Negative for abdominal pain, nausea and vomiting.  Musculoskeletal: Positive for myalgias.  Negative for neck stiffness.  Skin: Negative for rash.  Allergic/Immunologic: Positive for immunocompromised state.  Neurological: Negative for headaches.     Physical Exam Updated Vital Signs BP (!) 169/102   Pulse 115   Temp (S) 102.3 F (39.1 C) (Oral)   Resp 22   Wt 108.9 kg   SpO2 96%   BMI 34.44 kg/m   Physical Exam  Constitutional: He is oriented to person, place, and time. He appears well-developed and well-nourished. No distress.  HENT:  Head: Normocephalic and atraumatic.  No focal sinus tenderness. + nasal congestion and edema. OP with erythema, no exudates or tonsillar hypertrophy.   Neck:  No meningeal signs.  Cardiovascular: Normal heart sounds.  Exam reveals no gallop and no friction rub.   No murmur heard. Mildly tachycardic but regular.   Pulmonary/Chest: Effort normal and breath sounds normal. No respiratory distress. He has no wheezes. He has no rales. He exhibits no tenderness.  Abdominal: Soft. He exhibits no distension. There is no tenderness.  Neurological: He is alert and oriented to person, place, and time.  Skin: Skin is warm and dry.  Nursing note and vitals reviewed.    ED Treatments / Results  Labs (all labs ordered are listed, but only abnormal results are displayed) Labs Reviewed  COMPREHENSIVE METABOLIC PANEL - Abnormal; Notable for the following:       Result Value   Sodium 134 (*)    Chloride 100 (*)    Glucose, Bld 119 (*)    Creatinine, Ser 1.37 (*)    All other components within normal limits  CBC WITH DIFFERENTIAL/PLATELET - Abnormal; Notable for the following:    WBC 3.0 (*)    Neutro Abs 0.1 (*)    Monocytes Absolute 1.9 (*)    All other components within normal limits  URINALYSIS, ROUTINE W REFLEX MICROSCOPIC (NOT AT [facility]) - Abnormal; Notable for the following:    Protein, ur 30 (*)    All other components within normal limits  I-STAT CHEM 8, ED - Abnormal; Notable for the following:    Potassium 5.3 (*)    Chloride  100 (*)    BUN 24 (*)    Creatinine, Ser 1.30 (*)    Glucose, Bld 131 (*)    Calcium, Ion 1.11 (*)    All other components within normal limits  RAPID STREP SCREEN (NOT AT [facility])  CULTURE, BLOOD (ROUTINE X 2)  CULTURE, BLOOD (ROUTINE X 2)  CULTURE, GROUP A STREP [facility])  URINE CULTURE  INFLUENZA PANEL BY PCR (TYPE A & B, H1N1)  URINE MICROSCOPIC-ADD ON  I-STAT CG4 LACTIC ACID, ED    EKG  EKG Interpretation  Date/Time:  Wednesday January 22 2016 09:26:18 EDT Ventricular Rate:  96 PR Interval:    QRS Duration: 80 QT Interval:  313 QTC Calculation: 396 R Axis:   25 Text Interpretation:  Sinus rhythm Ventricular premature complex  Biatrial enlargement ST elev, probable normal early repol pattern Confirmed by [facility] MD, PEDRO [phone]) on 01/22/2016 9:36:00 AM       Radiology Dg Chest 2 View  Result Date: 01/22/2016 CLINICAL DATA:  Cough and congestion for 3 days.  Fever for 1 day EXAM: CHEST  2 VIEW COMPARISON:  February 24, 2011 FINDINGS: Lungs are clear. Heart size and pulmonary vascularity are normal. No adenopathy. No pneumothorax. No bone lesions. IMPRESSION: No edema or consolidation. Electronically Signed   By: Bretta Bang III M.D.   On: 01/22/2016 09:21    Procedures Procedures (including critical care time)  Medications Ordered in ED Medications  ceFEPIme (MAXIPIME) 2 g in dextrose 5 % 50 mL IVPB (not administered)  vancomycin (VANCOCIN) IVPB 1000 mg/200 mL premix (not administered)  acetaminophen (TYLENOL) tablet 650 mg (not administered)  acetaminophen (TYLENOL) tablet 650 mg (650 mg Oral Given 01/22/16 0901)  oseltamivir (TAMIFLU) capsule 75 mg (75 mg Oral Given 01/22/16 1101)  ceFEPIme (MAXIPIME) 2 g in dextrose 5 % 50 mL IVPB (0 g Intravenous Stopped 01/22/16 1445)  vancomycin (VANCOCIN) 2,000 mg in sodium chloride 0.9 % 500 mL IVPB (0 mg Intravenous Stopped 01/22/16 1650)     Initial Impression / Assessment and Plan / ED Course  I have reviewed the triage  vital signs and the nursing notes.  Pertinent labs & imaging results that were available during my care of the patient were reviewed by me and considered in my medical decision making (see chart for details).  Clinical Course  Value Comment By Time  NEUT#: (!) 0.1 (Reviewed) Nira Conn, MD 09/20 1056   Emerson E Broner is a 35 y.o. male with PMH of pancreas and kidney transplant four years ago currently taking anti-rejection medications as directed who presents to ED with upper respiratory complaints. He is febrile 101.7 in triage and mildly tachycardic. Blood cx's obtained. Lactic wdl.  Rapid strep negative and CXR with no PNA.  White count is 3.0 and abs neutro 0.1. Heme/onc consulted given neutropenic febrile illness who recommends admission for further workup. Given patient is followed by [facility] transplant team, heme/onc and myself agree that transfer to [facility] for admission for continuity of care is in the best interest of the patient. Patient agrees to transfer.  Spoke with [facility]al Team, Dr. Georges Mouse who accepts patient. Recommends starting on broad spectrum ABX, obtained UA and cx. He will be transferred to [facility] when bed becomes available.   Patient seen by and discussed with Dr. Eudelia Bunch who agrees with treatment plan.    Final Clinical Impressions(s) / ED Diagnoses   Final diagnoses:  Cough  Other neutropenia (HCC)  Febrile illness    New Prescriptions New Prescriptions   No medications on file     [facility] Rishav Rockefeller, PA-C 01/22/16 1908

## 2016-01-23 LAB — URINE CULTURE: Culture: NO GROWTH

## 2016-01-23 LAB — PATHOLOGIST SMEAR REVIEW

## 2016-01-24 ENCOUNTER — Ambulatory Visit: Payer: Medicare Other | Admitting: Podiatry

## 2016-01-24 LAB — CULTURE, GROUP A STREP (THRC)

## 2016-01-27 LAB — CULTURE, BLOOD (ROUTINE X 2)
Culture: NO GROWTH
Culture: NO GROWTH

## 2016-01-29 DIAGNOSIS — E1022 Type 1 diabetes mellitus with diabetic chronic kidney disease: Secondary | ICD-10-CM | POA: Diagnosis not present

## 2016-01-29 DIAGNOSIS — I120 Hypertensive chronic kidney disease with stage 5 chronic kidney disease or end stage renal disease: Secondary | ICD-10-CM | POA: Diagnosis not present

## 2016-01-29 DIAGNOSIS — E1040 Type 1 diabetes mellitus with diabetic neuropathy, unspecified: Secondary | ICD-10-CM | POA: Diagnosis not present

## 2016-01-29 DIAGNOSIS — Z4822 Encounter for aftercare following kidney transplant: Secondary | ICD-10-CM | POA: Diagnosis not present

## 2016-01-29 DIAGNOSIS — N186 End stage renal disease: Secondary | ICD-10-CM | POA: Diagnosis not present

## 2016-01-29 DIAGNOSIS — Z9483 Pancreas transplant status: Secondary | ICD-10-CM | POA: Diagnosis not present

## 2016-01-29 DIAGNOSIS — I10 Essential (primary) hypertension: Secondary | ICD-10-CM | POA: Diagnosis not present

## 2016-01-29 DIAGNOSIS — E669 Obesity, unspecified: Secondary | ICD-10-CM | POA: Diagnosis not present

## 2016-01-29 DIAGNOSIS — Z79899 Other long term (current) drug therapy: Secondary | ICD-10-CM | POA: Diagnosis not present

## 2016-01-29 DIAGNOSIS — B349 Viral infection, unspecified: Secondary | ICD-10-CM | POA: Diagnosis not present

## 2016-01-29 DIAGNOSIS — Z940 Kidney transplant status: Secondary | ICD-10-CM | POA: Diagnosis not present

## 2016-01-29 DIAGNOSIS — D899 Disorder involving the immune mechanism, unspecified: Secondary | ICD-10-CM | POA: Diagnosis not present

## 2016-02-03 DIAGNOSIS — Z48288 Encounter for aftercare following multiple organ transplant: Secondary | ICD-10-CM | POA: Diagnosis not present

## 2016-02-03 DIAGNOSIS — D8989 Other specified disorders involving the immune mechanism, not elsewhere classified: Secondary | ICD-10-CM | POA: Diagnosis not present

## 2016-02-03 DIAGNOSIS — E119 Type 2 diabetes mellitus without complications: Secondary | ICD-10-CM | POA: Diagnosis not present

## 2016-02-03 DIAGNOSIS — I10 Essential (primary) hypertension: Secondary | ICD-10-CM | POA: Diagnosis not present

## 2016-02-03 DIAGNOSIS — Z940 Kidney transplant status: Secondary | ICD-10-CM | POA: Diagnosis not present

## 2016-02-03 DIAGNOSIS — Z9483 Pancreas transplant status: Secondary | ICD-10-CM | POA: Diagnosis not present

## 2016-02-03 DIAGNOSIS — N390 Urinary tract infection, site not specified: Secondary | ICD-10-CM | POA: Diagnosis not present

## 2016-02-20 ENCOUNTER — Encounter: Payer: Self-pay | Admitting: Podiatry

## 2016-02-20 ENCOUNTER — Ambulatory Visit (INDEPENDENT_AMBULATORY_CARE_PROVIDER_SITE_OTHER): Payer: BLUE CROSS/BLUE SHIELD | Admitting: Podiatry

## 2016-02-20 ENCOUNTER — Ambulatory Visit (INDEPENDENT_AMBULATORY_CARE_PROVIDER_SITE_OTHER): Payer: Medicare Other

## 2016-02-20 VITALS — BP 114/69 | HR 86 | Resp 16

## 2016-02-20 DIAGNOSIS — M79672 Pain in left foot: Secondary | ICD-10-CM

## 2016-02-20 DIAGNOSIS — E1149 Type 2 diabetes mellitus with other diabetic neurological complication: Secondary | ICD-10-CM | POA: Diagnosis not present

## 2016-02-20 DIAGNOSIS — T148XXA Other injury of unspecified body region, initial encounter: Secondary | ICD-10-CM | POA: Diagnosis not present

## 2016-02-20 NOTE — Progress Notes (Signed)
Subjective:     Patient ID: Derek Johnston, male   DOB: 11/05/1980, 35 y.o.   MRN: [id number]  HPI patient states he felt a snap in the bottom of his left foot and it's been very sore for the last 2 days he cannot bear weight on his arch   Review of Systems     Objective:   Physical Exam Neurovascular status intact with exquisite discomfort in the plantar arch left with what appears to be a disruption of tendon fibers    Assessment:     Plantar fascial tear left acute in nature    Plan:     H&P x-ray reviewed advised on ice and placed in short air fracture walker to provide stability. Reappoint to Dr. Ardelle AntonWagoner to reevaluate in 2 weeks or earlier if needed  X-ray report was negative for signs of fracture of the calcaneus left or other bone pathology was spur formation noted

## 2016-02-24 DIAGNOSIS — D8989 Other specified disorders involving the immune mechanism, not elsewhere classified: Secondary | ICD-10-CM | POA: Diagnosis not present

## 2016-02-24 DIAGNOSIS — I120 Hypertensive chronic kidney disease with stage 5 chronic kidney disease or end stage renal disease: Secondary | ICD-10-CM | POA: Diagnosis not present

## 2016-02-24 DIAGNOSIS — E1143 Type 2 diabetes mellitus with diabetic autonomic (poly)neuropathy: Secondary | ICD-10-CM | POA: Diagnosis not present

## 2016-02-24 DIAGNOSIS — Z7982 Long term (current) use of aspirin: Secondary | ICD-10-CM | POA: Diagnosis not present

## 2016-02-24 DIAGNOSIS — Z940 Kidney transplant status: Secondary | ICD-10-CM | POA: Diagnosis not present

## 2016-02-24 DIAGNOSIS — E669 Obesity, unspecified: Secondary | ICD-10-CM | POA: Diagnosis not present

## 2016-02-24 DIAGNOSIS — Z9483 Pancreas transplant status: Secondary | ICD-10-CM | POA: Diagnosis not present

## 2016-02-24 DIAGNOSIS — D631 Anemia in chronic kidney disease: Secondary | ICD-10-CM | POA: Diagnosis not present

## 2016-02-24 DIAGNOSIS — E1122 Type 2 diabetes mellitus with diabetic chronic kidney disease: Secondary | ICD-10-CM | POA: Diagnosis not present

## 2016-02-24 DIAGNOSIS — D899 Disorder involving the immune mechanism, unspecified: Secondary | ICD-10-CM | POA: Diagnosis not present

## 2016-02-24 DIAGNOSIS — Z4822 Encounter for aftercare following kidney transplant: Secondary | ICD-10-CM | POA: Diagnosis not present

## 2016-02-24 DIAGNOSIS — Z79899 Other long term (current) drug therapy: Secondary | ICD-10-CM | POA: Diagnosis not present

## 2016-02-24 DIAGNOSIS — N179 Acute kidney failure, unspecified: Secondary | ICD-10-CM | POA: Diagnosis not present

## 2016-02-24 DIAGNOSIS — E785 Hyperlipidemia, unspecified: Secondary | ICD-10-CM | POA: Diagnosis not present

## 2016-02-24 DIAGNOSIS — N186 End stage renal disease: Secondary | ICD-10-CM | POA: Diagnosis not present

## 2016-02-24 DIAGNOSIS — E872 Acidosis: Secondary | ICD-10-CM | POA: Diagnosis not present

## 2016-02-25 ENCOUNTER — Ambulatory Visit: Payer: Medicare Other | Admitting: Podiatry

## 2016-03-06 ENCOUNTER — Encounter: Payer: Self-pay | Admitting: Podiatry

## 2016-03-06 ENCOUNTER — Ambulatory Visit (INDEPENDENT_AMBULATORY_CARE_PROVIDER_SITE_OTHER): Payer: Medicare Other | Admitting: Podiatry

## 2016-03-06 DIAGNOSIS — E1149 Type 2 diabetes mellitus with other diabetic neurological complication: Secondary | ICD-10-CM

## 2016-03-06 DIAGNOSIS — B351 Tinea unguium: Secondary | ICD-10-CM

## 2016-03-06 DIAGNOSIS — T148XXA Other injury of unspecified body region, initial encounter: Secondary | ICD-10-CM

## 2016-03-06 DIAGNOSIS — M79676 Pain in unspecified toe(s): Secondary | ICD-10-CM | POA: Diagnosis not present

## 2016-03-06 DIAGNOSIS — M14671 Charcot's joint, right ankle and foot: Secondary | ICD-10-CM

## 2016-03-06 DIAGNOSIS — M722 Plantar fascial fibromatosis: Secondary | ICD-10-CM | POA: Diagnosis not present

## 2016-03-06 NOTE — Progress Notes (Signed)
Patient ID: Derek Johnston, male   DOB: 07/10/1980, 35 y.o.   MRN: [id number]  Subjective: 35 y.o. returns the office today for painful, elongated, thickened toenails which he cannot trim himself. Denies any redness or drainage around the nails. 2 weeks ago he came to the office after he injured his left foot and he had quite a bit of pain he was treated for torn plantar fascia. He was the boot for couple days and the pain didn't resolve it he states he is no longer having any pain to the left foot and he has not had any swelling or redness. He did purchase an over-the-counter insert which has helped.  Denies any acute changes since last appointment and no new complaints today. Denies any systemic complaints such as fevers, chills, nausea, vomiting.   Objective: AAO 3, NAD DP/PT pulses palpable, CRT less than 3 seconds Protective sensation decreased with Simms Weinstein monofilament Nails hypertrophic, dystrophic, elongated, brittle, discolored 10. There is tenderness overlying the nails 1-5 bilaterally. There is no surrounding erythema or drainage along the nail sites. No open lesions or pre-ulcerative lesions are identified. Some there is no tenderness palpation to the left heel and along the course of plantar fascia. There is no overlying edema, erythema, increase in warmth bilaterally. There is no open lesions identified. There is no area pinpoint tenderness or pain the vibratory sensation. No pain with calf compression, swelling, warmth, erythema.  Assessment: Patient presents with symptomatic onychomycosis; resolved left foot pain; history of Charcot with neuropathy   Plan: -Treatment options discussed including all alternatives, risks, and complications -Nails debrided 10 without complications or bleeding. -Daily foot inspection -Patient requesting custom inserts today. He [facility] He states "I am sure they are covered". He wants to go ahead and  proceed  -Follow-up in 3 months or sooner if any problems arise. In the meantime, encouraged to call the office with any questions, concerns, change in symptoms.   Derek Johnston, DPM

## 2016-03-30 ENCOUNTER — Ambulatory Visit: Payer: Medicare Other | Admitting: Podiatry

## 2016-04-08 DIAGNOSIS — E785 Hyperlipidemia, unspecified: Secondary | ICD-10-CM | POA: Diagnosis not present

## 2016-04-08 DIAGNOSIS — N2581 Secondary hyperparathyroidism of renal origin: Secondary | ICD-10-CM | POA: Diagnosis not present

## 2016-04-08 DIAGNOSIS — R809 Proteinuria, unspecified: Secondary | ICD-10-CM | POA: Diagnosis not present

## 2016-04-08 DIAGNOSIS — N185 Chronic kidney disease, stage 5: Secondary | ICD-10-CM | POA: Diagnosis not present

## 2016-04-08 DIAGNOSIS — E1129 Type 2 diabetes mellitus with other diabetic kidney complication: Secondary | ICD-10-CM | POA: Diagnosis not present

## 2016-04-08 DIAGNOSIS — Z6835 Body mass index (BMI) 35.0-35.9, adult: Secondary | ICD-10-CM | POA: Diagnosis not present

## 2016-04-08 DIAGNOSIS — D631 Anemia in chronic kidney disease: Secondary | ICD-10-CM | POA: Diagnosis not present

## 2016-04-08 DIAGNOSIS — G629 Polyneuropathy, unspecified: Secondary | ICD-10-CM | POA: Diagnosis not present

## 2016-05-25 ENCOUNTER — Ambulatory Visit (INDEPENDENT_AMBULATORY_CARE_PROVIDER_SITE_OTHER): Payer: BLUE CROSS/BLUE SHIELD | Admitting: Podiatry

## 2016-05-25 ENCOUNTER — Encounter: Payer: Self-pay | Admitting: Podiatry

## 2016-05-25 VITALS — BP 137/88 | HR 80 | Resp 18

## 2016-05-25 DIAGNOSIS — B351 Tinea unguium: Secondary | ICD-10-CM | POA: Diagnosis not present

## 2016-05-25 DIAGNOSIS — E1149 Type 2 diabetes mellitus with other diabetic neurological complication: Secondary | ICD-10-CM

## 2016-05-25 DIAGNOSIS — M79676 Pain in unspecified toe(s): Secondary | ICD-10-CM | POA: Diagnosis not present

## 2016-05-25 DIAGNOSIS — M14671 Charcot's joint, right ankle and foot: Secondary | ICD-10-CM

## 2016-05-25 NOTE — Progress Notes (Signed)
Patient ID: Derek RedbirdDewayne E Johnston, male   DOB: 10/16/1980, [age] y.o.   MRN: [id number]  Subjective: 36 y.o. returns the office today for painful, elongated, thickened toenails which he cannot trim himself. Denies any redness or drainage to the nail sites. He also denies any increase in swelling or warmth to his feet. He presents today to [facility] as well.   Denies any acute changes since last appointment and no new complaints today. Denies any systemic complaints such as fevers, chills, nausea, vomiting.   Objective: AAO 3, NAD DP/PT pulses palpable, CRT less than 3 seconds Protective sensation decreased with Simms Weinstein monofilament Nails hypertrophic, dystrophic, elongated, brittle, discolored 10. There is tenderness overlying the nails 1-5 bilaterally. There is no surrounding erythema or drainage along the nail sites. No open lesions or pre-ulcerative lesions are identified. Decrease in medial arch height on the right from Charcot and mild plantar prominence.  No pain with calf compression, swelling, warmth, erythema.  Assessment: Patient presents with symptomatic onychomycosis;  history of Charcot with neuropathy   Plan: -Treatment options discussed including all alternatives, risks, and complications -Nails debrided 10 without complications or bleeding. -Daily foot inspection -Orthotics dispensed today. Oral and written break in instructions discussed. Monitor the feet daily for any skin irration.  -Follow-up in 3 months or sooner if any problems arise. In the meantime, encouraged to call the office with any questions, concerns, change in symptoms.   Ovid CurdMatthew Wagoner, DPM

## 2016-05-25 NOTE — Patient Instructions (Signed)

## 2016-06-24 DIAGNOSIS — Z792 Long term (current) use of antibiotics: Secondary | ICD-10-CM | POA: Diagnosis not present

## 2016-06-24 DIAGNOSIS — Z9889 Other specified postprocedural states: Secondary | ICD-10-CM | POA: Diagnosis not present

## 2016-06-24 DIAGNOSIS — D649 Anemia, unspecified: Secondary | ICD-10-CM | POA: Diagnosis not present

## 2016-06-24 DIAGNOSIS — Z48288 Encounter for aftercare following multiple organ transplant: Secondary | ICD-10-CM | POA: Insufficient documentation

## 2016-06-24 DIAGNOSIS — N186 End stage renal disease: Secondary | ICD-10-CM | POA: Diagnosis not present

## 2016-06-24 DIAGNOSIS — Z4822 Encounter for aftercare following kidney transplant: Secondary | ICD-10-CM | POA: Diagnosis not present

## 2016-06-24 DIAGNOSIS — E1142 Type 2 diabetes mellitus with diabetic polyneuropathy: Secondary | ICD-10-CM | POA: Diagnosis not present

## 2016-06-24 DIAGNOSIS — Z7982 Long term (current) use of aspirin: Secondary | ICD-10-CM | POA: Diagnosis not present

## 2016-06-24 DIAGNOSIS — I120 Hypertensive chronic kidney disease with stage 5 chronic kidney disease or end stage renal disease: Secondary | ICD-10-CM | POA: Diagnosis not present

## 2016-06-24 DIAGNOSIS — E669 Obesity, unspecified: Secondary | ICD-10-CM | POA: Diagnosis not present

## 2016-06-24 DIAGNOSIS — E11319 Type 2 diabetes mellitus with unspecified diabetic retinopathy without macular edema: Secondary | ICD-10-CM | POA: Diagnosis not present

## 2016-06-24 DIAGNOSIS — I10 Essential (primary) hypertension: Secondary | ICD-10-CM | POA: Diagnosis not present

## 2016-06-24 DIAGNOSIS — E785 Hyperlipidemia, unspecified: Secondary | ICD-10-CM | POA: Diagnosis not present

## 2016-06-24 DIAGNOSIS — Z6834 Body mass index (BMI) 34.0-34.9, adult: Secondary | ICD-10-CM | POA: Diagnosis not present

## 2016-06-24 DIAGNOSIS — E871 Hypo-osmolality and hyponatremia: Secondary | ICD-10-CM | POA: Diagnosis not present

## 2016-06-24 DIAGNOSIS — Z9483 Pancreas transplant status: Secondary | ICD-10-CM | POA: Diagnosis not present

## 2016-06-24 DIAGNOSIS — Z79899 Other long term (current) drug therapy: Secondary | ICD-10-CM | POA: Diagnosis not present

## 2016-06-24 DIAGNOSIS — Z940 Kidney transplant status: Secondary | ICD-10-CM | POA: Diagnosis not present

## 2016-06-24 DIAGNOSIS — E1122 Type 2 diabetes mellitus with diabetic chronic kidney disease: Secondary | ICD-10-CM | POA: Diagnosis not present

## 2016-06-24 DIAGNOSIS — D899 Disorder involving the immune mechanism, unspecified: Secondary | ICD-10-CM | POA: Diagnosis not present

## 2016-06-24 DIAGNOSIS — D8989 Other specified disorders involving the immune mechanism, not elsewhere classified: Secondary | ICD-10-CM | POA: Diagnosis not present

## 2016-06-24 DIAGNOSIS — E119 Type 2 diabetes mellitus without complications: Secondary | ICD-10-CM | POA: Diagnosis not present

## 2016-06-24 DIAGNOSIS — Z7952 Long term (current) use of systemic steroids: Secondary | ICD-10-CM | POA: Diagnosis not present

## 2016-06-24 LAB — HEMOGLOBIN A1C: Hemoglobin A1C: 6.1

## 2016-08-24 ENCOUNTER — Ambulatory Visit: Payer: Medicare Other | Admitting: Podiatry

## 2016-08-27 ENCOUNTER — Encounter: Payer: Self-pay | Admitting: Pediatrics

## 2016-08-27 ENCOUNTER — Ambulatory Visit (INDEPENDENT_AMBULATORY_CARE_PROVIDER_SITE_OTHER): Payer: BLUE CROSS/BLUE SHIELD | Admitting: Pediatrics

## 2016-08-27 VITALS — BP 130/85 | HR 94 | Temp 98.9°F | Ht 70.0 in | Wt 236.0 lb

## 2016-08-27 DIAGNOSIS — J3089 Other allergic rhinitis: Secondary | ICD-10-CM

## 2016-08-27 MED ORDER — CETIRIZINE HCL 10 MG PO TABS: 10.0000 mg | ORAL_TABLET | Freq: Every day | ORAL | 0 refills | Status: DC

## 2016-08-27 MED ORDER — FLUTICASONE PROPIONATE 50 MCG/ACT NA SUSP: 2.0000 | Freq: Every day | NASAL | 0 refills | Status: AC

## 2016-08-27 NOTE — Progress Notes (Addendum)
  Subjective:   Patient ID: Derek Johnston, male    DOB: 1981-03-02, 36 y.o.   MRN: [id number] CC: Sore Throat; Cough; and Ear Pain (Bilateral)  HPI: Derek Johnston is a 36 y.o. male presenting for Sore Throat; Cough; and Ear Pain (Bilateral)  ongoing about a week No fevers Ears popping Some coughing last week, feeling better today  Appetite was down last week, feeling better today Thinks back to eating and drinking normally Still feeling slightly congested in sinuses still, ears No facial pain  H/o pancreas, kidney transplant Followed at [facility] Dr Hyman Hopes is pt's nephrologist in [place]  Past Medical History:  Diagnosis Date  . Anemia   . Chronic kidney disease    stage 4   . Diabetes mellitus   . Dyslipidemia   . Hyperlipidemia   . Peripheral neuropathy   . Retinopathy   . Swelling of extremity    Family History  Problem Relation Age of Onset  . Hypertension Mother   . Kidney disease Maternal Aunt   . Kidney disease Maternal Grandmother    Social History   Social History  . Marital status: Married    Spouse name: N/A  . Number of children: N/A  . Years of education: N/A   Social History Main Topics  . Smoking status: Never Smoker  . Smokeless tobacco: Never Used  . Alcohol use No  . Drug use: No  . Sexual activity: Yes   Other Topics Concern  . None   Social History Narrative  . None   ROS: All systems negative other than what is in HPI  Objective:    BP 130/85   Pulse 94   Temp 98.9 F (37.2 C) (Oral)   Ht  (1.778 m)   Wt 236 lb (107 kg)   BMI 33.86 kg/m   Wt Readings from Last 3 Encounters:  08/27/16 236 lb (107 kg)  01/22/16 240 lb (108.9 kg)  12/06/13 240 lb (108.9 kg)    Gen: NAD, alert, cooperative with exam, NCAT EYES: EOMI, no conjunctival injection, or no icterus ENT:  TMs dull pink b/l, no effusion, OP without erythema, no ttp over sinuses LYMPH: no cervical LAD CV: NRRR, normal S1/S2, no murmur Resp: CTABL, no wheezes,  normal WOB Abd: soft, non-tender Ext: No edema, warm Neuro: Alert and oriented  Assessment & Plan:  Derek Johnston was seen today for sore throat, cough and ear pain. Complicated PMH including kidney/pancreas transplant, DM. Benign exam, some congestion, no fevers.  Diagnoses and all orders for this visit:  Allergic rhinitis due to other allergic trigger, unspecified seasonality Will treat for allergies with below -     cetirizine (ZYRTEC) 10 MG tablet; Take 1 tablet (10 mg total) by mouth daily. -     fluticasone (FLONASE) 50 MCG/ACT nasal spray; Place 2 sprays into both nostrils daily.  Follow up plan: Follow up next week or sooner for any worsening symptoms Rex Kras, MD [facility]

## 2016-09-04 ENCOUNTER — Encounter: Payer: Self-pay | Admitting: Podiatry

## 2016-09-04 ENCOUNTER — Ambulatory Visit (INDEPENDENT_AMBULATORY_CARE_PROVIDER_SITE_OTHER): Payer: BLUE CROSS/BLUE SHIELD | Admitting: Podiatry

## 2016-09-04 DIAGNOSIS — E1149 Type 2 diabetes mellitus with other diabetic neurological complication: Secondary | ICD-10-CM

## 2016-09-04 DIAGNOSIS — M79676 Pain in unspecified toe(s): Secondary | ICD-10-CM | POA: Diagnosis not present

## 2016-09-04 DIAGNOSIS — B351 Tinea unguium: Secondary | ICD-10-CM | POA: Diagnosis not present

## 2016-09-04 NOTE — Progress Notes (Signed)
Patient ID: Derek RedbirdDewayne E Johnston, male   DOB: 01/17/1981, 36 y.o.   MRN: [id number]  Subjective: 36 y.o. returns the office today for painful, elongated, thickened toenails which he cannot trim himself. Denies any redness or drainage to the nail sites. He also denies any increase in swelling or warmth to his feet. He has been wearing the inserts and states they are fitting well.  Denies any acute changes since last appointment and no new complaints today. Denies any systemic complaints such as fevers, chills, nausea, vomiting.   Objective: AAO 3, NAD DP/PT pulses palpable, CRT less than 3 seconds Protective sensation decreased with Simms Weinstein monofilament Nails hypertrophic, dystrophic, elongated, brittle, discolored 10. There is tenderness overlying the nails 1-5 bilaterally. There is no surrounding erythema or drainage along the nail sites. No open lesions or pre-ulcerative lesions are identified. Decrease in medial arch height on the right from Charcot and mild plantar prominence.  No pain with calf compression, swelling, warmth, erythema.  Assessment: Patient presents with symptomatic onychomycosis;  history of Charcot with neuropathy   Plan: -Treatment options discussed including all alternatives, risks, and complications -Nails debrided 10 without complications or bleeding. -Daily foot inspection -Continue custom inserts.  -Follow-up in 3 months or sooner if any problems arise. In the meantime, encouraged to call the office with any questions, concerns, change in symptoms.   Ovid CurdMatthew Yailene Badia, DPM

## 2016-09-08 ENCOUNTER — Encounter: Payer: Self-pay | Admitting: Pharmacist

## 2016-09-23 ENCOUNTER — Other Ambulatory Visit: Payer: Self-pay | Admitting: Pediatrics

## 2016-09-23 DIAGNOSIS — J3089 Other allergic rhinitis: Secondary | ICD-10-CM

## 2016-12-04 ENCOUNTER — Encounter: Payer: Self-pay | Admitting: Podiatry

## 2016-12-04 ENCOUNTER — Ambulatory Visit (INDEPENDENT_AMBULATORY_CARE_PROVIDER_SITE_OTHER): Payer: BLUE CROSS/BLUE SHIELD | Admitting: Podiatry

## 2016-12-04 DIAGNOSIS — M79676 Pain in unspecified toe(s): Secondary | ICD-10-CM | POA: Diagnosis not present

## 2016-12-04 DIAGNOSIS — E1149 Type 2 diabetes mellitus with other diabetic neurological complication: Secondary | ICD-10-CM | POA: Diagnosis not present

## 2016-12-04 DIAGNOSIS — B351 Tinea unguium: Secondary | ICD-10-CM

## 2016-12-07 NOTE — Progress Notes (Signed)
Patient ID: Derek Johnston, male   DOB: 06/24/1980, 36 y.o.   MRN: [id number]  Subjective: 36 y.o. returns the office today for painful, elongated, thickened toenails which he cannot trim himself. Denies any redness or drainage to the nail sites.Denies any acute changes since last appointment and no new complaints today. Denies any systemic complaints such as fevers, chills, nausea, vomiting.   Objective: AAO 3, NAD DP/PT pulses palpable, CRT less than 3 seconds Protective sensation decreased with Simms Weinstein monofilament Nails hypertrophic, dystrophic, elongated, brittle, discolored 10. There is tenderness overlying the nails 1-5 bilaterally. There is no surrounding erythema or drainage along the nail sites. No open lesions or pre-ulcerative lesions are identified. Decrease in medial arch height on the right from Charcot and mild plantar prominence. No evidence of an acute Charcot event.  No pain with calf compression, swelling, warmth, erythema.  Assessment: Patient presents with symptomatic onychomycosis;  history of Charcot with neuropathy   Plan: -Treatment options discussed including all alternatives, risks, and complications -Nails debrided 10 without complications or bleeding. -Daily foot inspection -Continue custom inserts.  -Follow-up in 3 months or sooner if any problems arise. In the meantime, encouraged to call the office with any questions, concerns, change in symptoms.   Ovid CurdMatthew Maeson Lourenco, DPM

## 2016-12-09 DIAGNOSIS — Z6834 Body mass index (BMI) 34.0-34.9, adult: Secondary | ICD-10-CM | POA: Insufficient documentation

## 2017-03-08 ENCOUNTER — Ambulatory Visit (INDEPENDENT_AMBULATORY_CARE_PROVIDER_SITE_OTHER): Payer: BLUE CROSS/BLUE SHIELD

## 2017-03-08 ENCOUNTER — Encounter: Payer: Self-pay | Admitting: Podiatry

## 2017-03-08 ENCOUNTER — Ambulatory Visit: Payer: BLUE CROSS/BLUE SHIELD | Admitting: Podiatry

## 2017-03-08 DIAGNOSIS — E11610 Type 2 diabetes mellitus with diabetic neuropathic arthropathy: Secondary | ICD-10-CM | POA: Diagnosis not present

## 2017-03-08 DIAGNOSIS — E1149 Type 2 diabetes mellitus with other diabetic neurological complication: Secondary | ICD-10-CM

## 2017-03-08 DIAGNOSIS — M79676 Pain in unspecified toe(s): Secondary | ICD-10-CM

## 2017-03-08 DIAGNOSIS — B351 Tinea unguium: Secondary | ICD-10-CM | POA: Diagnosis not present

## 2017-03-08 DIAGNOSIS — M659 Synovitis and tenosynovitis, unspecified: Secondary | ICD-10-CM

## 2017-03-10 NOTE — Progress Notes (Addendum)
Patient ID: Derek RedbirdDewayne E Cranshaw, male   DOB: 10/22/1980, 36 y.o.   MRN: [id number]  Subjective: 36 y.o. returns the office today for painful, elongated, thickened toenails which he cannot trim himself. Denies any redness or drainage to the nail sites.he states that overall he is been getting pain to both of his feet to the point where he cannot go to work 7 days. He has continue to wear supportive shoe as well as the inserts for her shoes. He denies any recent injury or trauma denies any increase in swelling or any redness or warmth to his feet.  Denies any acute changes since last appointment and no new complaints today. Denies any systemic complaints such as fevers, chills, nausea, vomiting.   Objective: AAO 3, NAD DP/PT pulses palpable, CRT less than 3 seconds Protective sensation decreased with Simms Weinstein monofilament Nails hypertrophic, dystrophic, elongated, brittle, discolored 10. There is tenderness overlying the nails 1-5 bilaterally. There is no surrounding erythema or drainage along the nail sites. No open lesions or pre-ulcerative lesions are identified. Decrease in medial arch height on thebilaterally the foot appear to be symmetrical. There is mild swelling of feet there is no erythema or increase in warmth. There does not appear to be any clinical signs of an acute Charcot.  However it is chronic at this point.  No pain with calf compression, swelling, warmth, erythema.  Assessment: Patient presents with symptomatic onychomycosis; Charcot bilaterally/osteoarthritis;  diabetes with neuropathy  Plan: -Treatment options discussed including all alternatives, risks, and complications -Nails debrided 10 without complications or bleeding. -X-rays were obtained and reviewed. There is arthritic changes the talonavicular joint and evidence of Charcot bilaterally. This is not appear to be acute. -Daily foot inspection -Continue custom inserts. We will try for custom shoe to see if this  would more beneficial for him or a brace. I will get him in to see Raiford NobleRick.  -Follow-up in 3 months or sooner if any problems arise. In the meantime, encouraged to call the office with any questions, concerns, change in symptoms.   Ovid CurdMatthew Nahla Lukin, DPM

## 2017-03-15 ENCOUNTER — Other Ambulatory Visit: Payer: BLUE CROSS/BLUE SHIELD | Admitting: Orthotics

## 2017-05-21 ENCOUNTER — Encounter: Payer: Self-pay | Admitting: Cardiovascular Disease

## 2017-05-21 ENCOUNTER — Ambulatory Visit: Payer: BLUE CROSS/BLUE SHIELD | Admitting: Cardiovascular Disease

## 2017-05-21 VITALS — BP 134/84 | HR 75 | Ht 70.0 in | Wt 236.8 lb

## 2017-05-21 DIAGNOSIS — E7800 Pure hypercholesterolemia, unspecified: Secondary | ICD-10-CM

## 2017-05-21 DIAGNOSIS — I10 Essential (primary) hypertension: Secondary | ICD-10-CM | POA: Diagnosis not present

## 2017-05-21 NOTE — Assessment & Plan Note (Signed)
History of hyperlipidemia on statin therapy followed by his PCP 

## 2017-05-21 NOTE — Assessment & Plan Note (Signed)
History of essential hypertension blood pressure today 134/84. He is on amlodipine. Continue current meds at current dosing.

## 2017-05-21 NOTE — Progress Notes (Signed)
05/21/2017 Derek Johnston   1980-06-28  [id number]  Primary Physician Elvis Coil, MD Primary Cardiologist: Runell Gess MD Nicholes Cal[place], [place]  HPI:  Derek Johnston is a 37 y.o. moderately overweight married African-American male father of one biologic child and 2 adopted children who is referred by Dr. Hyman Hopes to be established in my cardiovascular practice for preventive care. He works as a Interior and spatial designer at [employer]. He does have a history of treated hypertension and hyperlipidemia. 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 He has never smoked. Never had a heart attack or stroke. There is no family history. He is otherwise asymptomatic.    No outpatient medications have been marked as taking for the 05/21/17 encounter (Office Visit) with Runell Gess, MD.     No Known Allergies  Social History   Socioeconomic History  . Marital status: Married    Spouse name: Not on file  . Number of children: Not on file  . Years of education: Not on file  . Highest education level: Not on file  Social Needs  . Financial resource strain: Not on file  . Food insecurity - worry: Not on file  . Food insecurity - inability: Not on file  . Transportation needs - medical: Not on file  . Transportation needs - non-medical: Not on file  Occupational History  . Not on file  Tobacco Use  . Smoking status: Never Smoker  . Smokeless tobacco: Never Used  Substance and Sexual Activity  . Alcohol use: No  . Drug use: No  . Sexual activity: Yes  Other Topics Concern  . Not on file  Social History Narrative  . Not on file     Review of Systems: General: negative for chills, fever, night sweats or weight changes.  Cardiovascular: negative for chest pain, dyspnea on exertion, edema, orthopnea, palpitations,  paroxysmal nocturnal dyspnea or shortness of breath Dermatological: negative for rash Respiratory: negative for cough or wheezing Urologic: negative for hematuria Abdominal: negative for nausea, vomiting, diarrhea, bright red blood per rectum, melena, or hematemesis Neurologic: negative for visual changes, syncope, or dizziness All other systems reviewed and are otherwise negative except as noted above.    Blood pressure 134/84, pulse 75, height 5\' 10"  (1.778 m), weight 236 lb 12.8 oz (107.4 kg).  General appearance: alert and no distress Neck: no adenopathy, no carotid bruit, no JVD, supple, symmetrical, trachea midline and thyroid not enlarged, symmetric, no tenderness/mass/nodules Lungs: clear to auscultation bilaterally Heart: regular rate and rhythm, S1, S2 normal, no murmur, click, rub or gallop Extremities: extremities normal, atraumatic, no cyanosis or edema Pulses: 2+ and symmetric Skin: Skin color, texture, turgor normal. No rashes or lesions Neurologic: Alert and oriented X 3, normal strength and tone. Normal symmetric reflexes. Normal coordination and gait  EKG sinus rhythm at 75 without ST or T-wave changes. I personally reviewed this EKG  ASSESSMENT AND PLAN:   HYPERCHOLESTEROLEMIA History of hyperlipidemia on statin therapy followed by his PCP  HYPERTENSION History of essential hypertension blood pressure today 134/84. He is on amlodipine. Continue current meds at current dosing.      Runell Gess MD FACP,FACC,FAHA, [facility] 05/21/2017 11:44 AM

## 2017-05-21 NOTE — Patient Instructions (Signed)
Medication Instructions: Your physician recommends that you continue on your current medications as directed. Please refer to the Current Medication list given to you today.   Follow-Up: Your physician recommends that you schedule a follow-up appointment as needed with Dr. Berry.    

## 2017-06-01 ENCOUNTER — Ambulatory Visit: Payer: BLUE CROSS/BLUE SHIELD | Admitting: Orthotics

## 2017-06-01 ENCOUNTER — Ambulatory Visit: Payer: BLUE CROSS/BLUE SHIELD | Admitting: Podiatry

## 2017-06-01 ENCOUNTER — Encounter: Payer: Self-pay | Admitting: Orthotics

## 2017-06-01 DIAGNOSIS — M79676 Pain in unspecified toe(s): Secondary | ICD-10-CM

## 2017-06-01 DIAGNOSIS — E1149 Type 2 diabetes mellitus with other diabetic neurological complication: Secondary | ICD-10-CM

## 2017-06-01 DIAGNOSIS — L84 Corns and callosities: Secondary | ICD-10-CM | POA: Diagnosis not present

## 2017-06-01 DIAGNOSIS — B351 Tinea unguium: Secondary | ICD-10-CM | POA: Diagnosis not present

## 2017-06-01 DIAGNOSIS — M14671 Charcot's joint, right ankle and foot: Secondary | ICD-10-CM

## 2017-06-01 DIAGNOSIS — M2042 Other hammer toe(s) (acquired), left foot: Secondary | ICD-10-CM

## 2017-06-01 DIAGNOSIS — E11610 Type 2 diabetes mellitus with diabetic neuropathic arthropathy: Secondary | ICD-10-CM

## 2017-06-01 NOTE — Progress Notes (Signed)
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  Due to this, it was decided he would get new f/o and not custom shoes.

## 2017-06-02 NOTE — Progress Notes (Signed)
Patient ID: Derek Johnston, male   DOB: 06/13/1980, 37 y.o.   MRN: [id number]  Subjective: 37 y.o. returns the office today for painful, elongated, thickened toenails which he cannot trim himself. Denies any redness or drainage to the nail sites. He still gets some intermittent pain to his feet and swelling at times but is intermittent. No recent injury or trauma. His main concern today is a painful corn on the left 4th toe. He states it hurts with shoes. No open sores/swelling.  Denies any acute changes since last appointment and no new complaints today. Denies any systemic complaints such as fevers, chills, nausea, vomiting.   Objective: AAO 3, NAD DP/PT pulses palpable, CRT less than 3 seconds Protective sensation decreased with Simms Weinstein monofilament Nails hypertrophic, dystrophic, elongated, brittle, discolored 10. There is tenderness overlying the nails 1-5 bilaterally. There is no surrounding erythema or drainage along the nail sites. Hyperkeratotic lesion to the dorsal left 4th PIPJ. No underlying ulceration, drainage, or signs of infection.  Hammertoes present.  No open lesions or other pre-ulcerative lesions are identified. Decrease in medial arch height bilaterally. Swelling is stable and there is no erythema or increase in warmth.  No pain with calf compression, swelling, warmth, erythema.  Assessment: Patient presents with symptomatic onychomycosis; Charcot bilaterally/osteoarthritis;  diabetes with neuropathy; hammertoe resulting in a hyperkeratotic lesion.   Plan: -Treatment options discussed including all alternatives, risks, and complications -Nails debrided 10 without complications or bleeding. -Hyperkeratotic lesion left 4th toe sharply debrided x 1 without any complications or bleeding. Offloading pads dispensed. He was asking about surgical intervention and we discussed today that if symptoms continue we can consider.  -Rick molded him for inserts today.  -X-rays  were obtained and reviewed. There is arthritic changes the talonavicular joint and evidence of Charcot bilaterally..  -Follow-up in 3 months or sooner if any problems arise. In the meantime, encouraged to call the office with any questions, concerns, change in symptoms.   Derek Johnston, DPM

## 2017-06-29 ENCOUNTER — Other Ambulatory Visit: Payer: BLUE CROSS/BLUE SHIELD | Admitting: Orthotics

## 2017-10-26 ENCOUNTER — Ambulatory Visit: Payer: BLUE CROSS/BLUE SHIELD | Admitting: Podiatry

## 2017-10-26 ENCOUNTER — Encounter: Payer: Self-pay | Admitting: Podiatry

## 2017-10-26 ENCOUNTER — Other Ambulatory Visit: Payer: Self-pay

## 2017-10-26 DIAGNOSIS — L03039 Cellulitis of unspecified toe: Secondary | ICD-10-CM | POA: Diagnosis not present

## 2017-10-26 DIAGNOSIS — B351 Tinea unguium: Secondary | ICD-10-CM | POA: Diagnosis not present

## 2017-10-26 DIAGNOSIS — E1149 Type 2 diabetes mellitus with other diabetic neurological complication: Secondary | ICD-10-CM | POA: Diagnosis not present

## 2017-10-26 DIAGNOSIS — M79676 Pain in unspecified toe(s): Secondary | ICD-10-CM | POA: Diagnosis not present

## 2017-10-26 DIAGNOSIS — M14671 Charcot's joint, right ankle and foot: Secondary | ICD-10-CM | POA: Diagnosis not present

## 2017-10-26 MED ORDER — CEPHALEXIN 500 MG PO CAPS: 500.0000 mg | ORAL_CAPSULE | Freq: Three times a day (TID) | ORAL | 0 refills | Status: DC

## 2017-10-27 NOTE — Progress Notes (Signed)
Subjective: 37 year old male presents the office today for concerns of thick, painful, elongated toenails that he cannot trim himself.  He denies any recent injury to his feet denies any changes.  He states that when it starts to [place] gets cold outside he does get pain to his feet.  He also works at [employer] he is on concrete floors he gets some intermittent discomfort.  He does state that when he wears the orthotics makes a big difference.  He must get the orthotics he has recovered he is also currently not recovered and he is going to pick up a new set of orthotics today. Denies any systemic complaints such as fevers, chills, nausea, vomiting. No acute changes since last appointment, and no other complaints at this time.   Objective: AAO x3, NAD DP/PT pulses palpable bilaterally, CRT less than 3 seconds Nails are hypertrophic, dystrophic, brittle, discolored, elongated 10.  Upon debridement of the right hallux toenail is loose and underlying nail bed there is a small amount of clear drainage and there is a granular wound base present.  The nail is loose in the distal nail fold firmly adhered on the proximal nail fold. there is no pus there is no ascending cellulitis or fluctuation or crepitation.   No open lesions or pre-ulcerative lesions.  There is no area pinpoint tenderness there is no significant increase in edema bilaterally.  There is prominence on the plantar lateral midfoot bilaterally and subjectively he gets discomfort on the dorsal medial midfoot along the talonavicular joint but there is minimal tenderness palpation today. No pain with calf compression, swelling, warmth, erythema  Assessment: Symptomatic onychomycosis, right hallux onycholysis with wound, Charcot  Plan: -All treatment options discussed with the patient including all alternatives, risks, complications.  I debrided the nails today.  Upon debridement of the right hallux toenail was loose manner nailbed  there was drainage.  The area was cleaned.-Small amount of silver nitrate was applied to the area the area was cleaned.  Antibiotic ointment and a bandage was applied.  I did continue with antibiotic ointment dressing changes daily.  Prescribed Keflex.  I will see him back in 2 weeks to check on this is sooner if needed.  Monitoring signs or symptoms of infection. -He was seen today by Raiford Nobleick and he was dispensed orthotics. -Patient encouraged to call the office with any questions, concerns, change in symptoms.   Vivi BarrackMatthew R Wagoner DPM

## 2017-11-11 ENCOUNTER — Ambulatory Visit: Payer: BLUE CROSS/BLUE SHIELD | Admitting: Podiatry

## 2018-03-24 ENCOUNTER — Other Ambulatory Visit: Payer: Self-pay | Admitting: Podiatry

## 2018-03-24 ENCOUNTER — Ambulatory Visit: Payer: BLUE CROSS/BLUE SHIELD | Admitting: Podiatry

## 2018-03-24 ENCOUNTER — Encounter: Payer: Self-pay | Admitting: Podiatry

## 2018-03-24 ENCOUNTER — Ambulatory Visit (INDEPENDENT_AMBULATORY_CARE_PROVIDER_SITE_OTHER): Payer: BLUE CROSS/BLUE SHIELD

## 2018-03-24 DIAGNOSIS — M14671 Charcot's joint, right ankle and foot: Secondary | ICD-10-CM | POA: Diagnosis not present

## 2018-03-24 DIAGNOSIS — M14672 Charcot's joint, left ankle and foot: Secondary | ICD-10-CM

## 2018-03-24 DIAGNOSIS — B351 Tinea unguium: Secondary | ICD-10-CM | POA: Diagnosis not present

## 2018-03-24 DIAGNOSIS — L03039 Cellulitis of unspecified toe: Secondary | ICD-10-CM

## 2018-03-24 DIAGNOSIS — E1149 Type 2 diabetes mellitus with other diabetic neurological complication: Secondary | ICD-10-CM | POA: Diagnosis not present

## 2018-03-24 DIAGNOSIS — M79676 Pain in unspecified toe(s): Secondary | ICD-10-CM

## 2018-03-24 DIAGNOSIS — M79671 Pain in right foot: Secondary | ICD-10-CM

## 2018-03-24 DIAGNOSIS — M79672 Pain in left foot: Secondary | ICD-10-CM

## 2018-03-24 MED ORDER — DOXYCYCLINE HYCLATE 100 MG PO TABS: 100.0000 mg | ORAL_TABLET | Freq: Two times a day (BID) | ORAL | 0 refills | Status: DC

## 2018-03-28 NOTE — Progress Notes (Signed)
Subjective: 37 year old male presents the office today for concerns of thick, elongated toenails that he cannot trim himself he is also describing pain more to the bottoms of the feet.  States that hurts with walking and is also on hard surfaces at work.  Is also going orthotics he is doing all right but if he is not wearing them he gets quite a bit of pain.  He denies any recent injury or falls and he denies any swelling or redness or any warmth of his feet.  Denies any open sores. Denies any systemic complaints such as fevers, chills, nausea, vomiting. No acute changes since last appointment, and no other complaints at this time.   Objective: AAO x3, NAD DP/PT pulses palpable bilaterally, CRT less than 3 seconds Nails are hypertrophic, dystrophic, brittle, discolored, elongated 10.  There is some mild drainage on the right hallux toenail.  But what it was last appointment he states that it did resolve previously.  There is no edema, erythema to the toenail.  No surrounding redness or drainage. Tenderness nails 1-5 bilaterally. No open lesions or pre-ulcerative lesions are identified today. There is no specific area pinpoint tenderness there is no increase in edema.  There is some mild chronic edema bilateral lower extremities there is no erythema or increase in warmth and there is equal temperature gradient bilaterally.  No evidence of acute Charcot.  He describes subjective tenderness more to the bottoms of the feet there is no specific area of tenderness. No open lesions or pre-ulcerative lesions.  No pain with calf compression, swelling, warmth, erythema  Assessment: Symptomatic onychomycosis with Charcot  Plan: -All treatment options discussed with the patient including all alternatives, risks, complications.  -X-rays were obtained reviewed bilaterally.  Charcot changes present bilaterally.  No definitive evidence of acute fracture identified today. -He previously had a CROW walker but he is  having Charcot changes bilaterally.  He is currently wearing better shoes with inserts.  He may benefit more from a brace.  I will have him follow-up with Raiford Nobleick for possible morning of bilateral AFOs.  -Nails debrided x10 without any complications or bleeding.  However there was some clear drainage from the right hallux toenail today again.  Prescribed doxycycline.  Recommend follow-up next 2 weeks for follow-up evaluation of this or sooner if needed. -Discussed daily foot inspection discussed that there is any signs of acute Charcot which she discussed today to call immediately. -Patient encouraged to call the office with any questions, concerns, change in symptoms.   Derek Johnston Derek Johnston Derek Johnston DPM

## 2018-04-14 ENCOUNTER — Telehealth: Payer: Self-pay | Admitting: Orthotics

## 2018-04-14 NOTE — Telephone Encounter (Signed)
Called patient to tell him his REMADE f/o are in:  Dr. Ardelle AntonWagoner wanted more cushion/arch support.  He will call and make appointment to p/u.   Dr. Dannial MonarchWag also suggested he would be candidate for AFO; he hestitaed before, but now is ready to consider.  Told him to make appointment for casting when he p/up f/o.

## 2018-05-24 ENCOUNTER — Other Ambulatory Visit: Payer: Self-pay | Admitting: Pediatrics

## 2018-05-24 DIAGNOSIS — J3089 Other allergic rhinitis: Secondary | ICD-10-CM

## 2018-05-25 ENCOUNTER — Other Ambulatory Visit: Payer: Self-pay | Admitting: Pediatrics

## 2018-05-25 DIAGNOSIS — J3089 Other allergic rhinitis: Secondary | ICD-10-CM

## 2018-08-02 ENCOUNTER — Other Ambulatory Visit: Payer: Self-pay

## 2018-08-02 ENCOUNTER — Ambulatory Visit (INDEPENDENT_AMBULATORY_CARE_PROVIDER_SITE_OTHER): Payer: BLUE CROSS/BLUE SHIELD

## 2018-08-02 ENCOUNTER — Ambulatory Visit: Payer: BLUE CROSS/BLUE SHIELD | Admitting: Podiatry

## 2018-08-02 ENCOUNTER — Encounter: Payer: Self-pay | Admitting: Podiatry

## 2018-08-02 VITALS — Temp 96.6°F

## 2018-08-02 DIAGNOSIS — M779 Enthesopathy, unspecified: Secondary | ICD-10-CM

## 2018-08-02 DIAGNOSIS — M14671 Charcot's joint, right ankle and foot: Secondary | ICD-10-CM

## 2018-08-02 DIAGNOSIS — E1149 Type 2 diabetes mellitus with other diabetic neurological complication: Secondary | ICD-10-CM | POA: Diagnosis not present

## 2018-08-02 DIAGNOSIS — B351 Tinea unguium: Secondary | ICD-10-CM

## 2018-08-02 DIAGNOSIS — M79676 Pain in unspecified toe(s): Secondary | ICD-10-CM | POA: Diagnosis not present

## 2018-08-02 MED ORDER — DICLOFENAC SODIUM 1 % TD GEL: 2.0000 g | Freq: Four times a day (QID) | TRANSDERMAL | 2 refills | Status: AC

## 2018-08-02 NOTE — Progress Notes (Signed)
Subjective: 38 year old male presents the office today for concerns of right ankle pain.  States he is been doing well he is actually off work for the last 2 weeks and his pain is improving.  He wears ankle brace intermittently.  He states that he can no other changes is any start to get discomfort to the ankle.  He denies recent injury or trauma.  He said chronic swelling to both of his feet and ankles.  Denies any increase in swelling denies any redness or warmth or any open sores.  He is also asking for his nails be trimmed today's are causing pain and they are getting elongated thick but denies any redness or drainage from the toenail sites. Denies any systemic complaints such as fevers, chills, nausea, vomiting. No acute changes since last appointment, and no other complaints at this time.   Objective: AAO x3, NAD DP/PT pulses palpable bilaterally, CRT less than 3 seconds Protective sensation decreased with Simms Weinstein monofilament There is evidence of Charcot deformity.  Is a flatfoot with a water foot present.  There is tenderness on the anterior ankle joint on the right side but there is no increase in warmth or any increase in swelling to this area.  Also is mild tenderness on the dorsal medial midfoot on the talonavicular joint area. The nails are hypertrophic, dystrophic with yellow-brown discoloration.  There is no redness or drainage or any signs of infection.  There is tenderness nails 1-5 bilaterally mostly with wearing shoes. No open lesions or pre-ulcerative lesions.  No pain with calf compression, swelling, warmth, erythema  Assessment: Charcot changes, arthritis right side; symptomatic onychomycosis  Plan: -All treatment options discussed with the patient including all alternatives, risks, complications.  -X-rays obtained reviewed the right ankle.  Arthritic changes present to the ankle joint as well as Charcot deformity present significant arthritis along the medial  foot. -Regards the right ankle pain I dispensed compression anklet bilaterally to help with swelling.  Also he has an ankle brace at home that I want him to wear.  I prescribed Voltaren gel to use as needed.  We can consider custom ankle brace as well.  Continue the orthotics for now. -I debrided the nails x10 without any complications or bleeding. -Patient encouraged to call the office with any questions, concerns, change in symptoms.   Vivi Barrack DPM

## 2018-11-01 ENCOUNTER — Other Ambulatory Visit: Payer: Self-pay

## 2018-11-01 ENCOUNTER — Encounter: Payer: Self-pay | Admitting: Podiatry

## 2018-11-01 ENCOUNTER — Ambulatory Visit: Payer: BC Managed Care – PPO | Admitting: Podiatry

## 2018-11-01 VITALS — Temp 97.6°F

## 2018-11-01 DIAGNOSIS — M25473 Effusion, unspecified ankle: Secondary | ICD-10-CM

## 2018-11-01 DIAGNOSIS — B351 Tinea unguium: Secondary | ICD-10-CM

## 2018-11-01 DIAGNOSIS — M14672 Charcot's joint, left ankle and foot: Secondary | ICD-10-CM

## 2018-11-01 DIAGNOSIS — M79676 Pain in unspecified toe(s): Secondary | ICD-10-CM | POA: Diagnosis not present

## 2018-11-01 DIAGNOSIS — M14671 Charcot's joint, right ankle and foot: Secondary | ICD-10-CM

## 2018-11-01 DIAGNOSIS — E1149 Type 2 diabetes mellitus with other diabetic neurological complication: Secondary | ICD-10-CM | POA: Diagnosis not present

## 2018-11-01 NOTE — Progress Notes (Signed)
Subjective: 38 y.o. returns the office today for follow-up evaluation of Charcot bilaterally with swelling.  Has been on for over the last 3 months and since then his feet are doing well and have very minimal swelling.  He does wear the orthotics when on his feet a lot otherwise his feet been doing well.  He also has painful, elongated, thickened toenails which they cannot trim themself. Denies any redness or drainage around the nails. Denies any acute changes since last appointment and no new complaints today. Denies any systemic complaints such as fevers, chills, nausea, vomiting.   PCP: Edrick Oh, MD  Objective: AAO 3, NAD DP/PT pulses palpable, CRT less than 3 seconds Protective sensation decreased with Simms Weinstein monofilament, Achilles tendon reflex intact.  Nails hypertrophic, dystrophic, elongated, brittle, discolored 10. There is tenderness overlying the nails 1-5 bilaterally. There is no surrounding erythema or drainage along the nail sites. Chronic swelling to bilateral ankles the right side worse than left.  Not able to elicit any area of tenderness.  There is no erythema or warmth or any signs of an active Charcot. No open lesions or pre-ulcerative lesions are identified. No other areas of tenderness bilateral lower extremities. No overlying edema, erythema, increased warmth. No pain with calf compression, swelling, warmth, erythema.  Assessment: Patient presents with symptomatic onychomycosis; Charcot  Plan: -Treatment options including alternatives, risks, complications were discussed -Nails sharply debrided 10* without complication/bleeding. -Although he is doing well currently he has not been on his feet.  He is going back to work on Monday.  Continue to support.  Also dispensed compression anklet help with swelling.  If needed an ankle brace. -Discussed daily foot inspection. If there are any changes, to call the office immediately.  -Follow-up in 3 months or sooner  if any problems are to arise. In the meantime, encouraged to call the office with any questions, concerns, changes symptoms.  Celesta Gentile, DPM

## 2018-11-28 ENCOUNTER — Other Ambulatory Visit: Payer: Self-pay

## 2018-11-28 DIAGNOSIS — Z20822 Contact with and (suspected) exposure to covid-19: Secondary | ICD-10-CM

## 2019-01-31 ENCOUNTER — Ambulatory Visit: Payer: BC Managed Care – PPO | Admitting: Podiatry

## 2019-03-16 ENCOUNTER — Other Ambulatory Visit: Payer: Self-pay

## 2019-03-16 ENCOUNTER — Encounter: Payer: Self-pay | Admitting: Podiatry

## 2019-03-16 ENCOUNTER — Ambulatory Visit: Payer: BC Managed Care – PPO | Admitting: Podiatry

## 2019-03-16 DIAGNOSIS — B351 Tinea unguium: Secondary | ICD-10-CM | POA: Diagnosis not present

## 2019-03-16 DIAGNOSIS — M79676 Pain in unspecified toe(s): Secondary | ICD-10-CM

## 2019-03-16 DIAGNOSIS — E1149 Type 2 diabetes mellitus with other diabetic neurological complication: Secondary | ICD-10-CM

## 2019-03-21 NOTE — Progress Notes (Signed)
Subjective: 38 y.o. returns the office today for concerns of thick, painful elongated toenails that he cannot trim himself.  Denies any redness or drainage or any swelling to the wound sites.  Pain on his feet are stable he states.  He states that he is on his feet for some time he does get some swelling.  He does continue to work at [employer] on his feet.  Is been recommended to him that he decrease the amount of time that he is working by his primary care physician but he does not want to do it. Denies any acute changes since last appointment and no new complaints today. Denies any systemic complaints such as fevers, chills, nausea, vomiting.   PCP: Edrick Oh, MD  Objective: AAO 3, NAD DP/PT pulses palpable, CRT less than 3 seconds Protective sensation decreased with Simms Weinstein monofilament, Achilles tendon reflex intact.  Nails hypertrophic, dystrophic, elongated, brittle, discolored 10. There is tenderness overlying the nails 1-5 bilaterally. There is no surrounding erythema or drainage along the nail sites. Chronic mild swelling to bilateral ankles the right side worse than left.  Not able to elicit any area of tenderness.  There is no erythema or warmth or any signs of an active Charcot. No open lesions or pre-ulcerative lesions are identified. No other areas of tenderness bilateral lower extremities. No overlying edema, erythema, increased warmth. No pain with calf compression, swelling, warmth, erythema.  Assessment: Patient presents with symptomatic onychomycosis; Charcot  Plan: -Treatment options including alternatives, risks, complications were discussed -Nails sharply debrided x 10 without complication/bleeding. -Charcot appears to be stable.  Continue with supportive shoes and his inserts. -Discussed daily foot inspection. If there are any changes, to call the office immediately.  -Follow-up in 3 months or sooner if any problems are to arise. In the meantime, encouraged to  call the office with any questions, concerns, changes symptoms.  Celesta Gentile, DPM

## 2019-04-04 ENCOUNTER — Ambulatory Visit: Payer: BC Managed Care – PPO | Admitting: Podiatry

## 2019-06-19 ENCOUNTER — Other Ambulatory Visit: Payer: Self-pay

## 2019-06-19 ENCOUNTER — Encounter: Payer: Self-pay | Admitting: Podiatry

## 2019-06-19 ENCOUNTER — Ambulatory Visit: Payer: BC Managed Care – PPO | Admitting: Podiatry

## 2019-06-19 DIAGNOSIS — M14671 Charcot's joint, right ankle and foot: Secondary | ICD-10-CM

## 2019-06-19 DIAGNOSIS — M79676 Pain in unspecified toe(s): Secondary | ICD-10-CM | POA: Diagnosis not present

## 2019-06-19 DIAGNOSIS — E1149 Type 2 diabetes mellitus with other diabetic neurological complication: Secondary | ICD-10-CM

## 2019-06-19 DIAGNOSIS — B351 Tinea unguium: Secondary | ICD-10-CM | POA: Diagnosis not present

## 2019-06-19 DIAGNOSIS — M14672 Charcot's joint, left ankle and foot: Secondary | ICD-10-CM

## 2019-06-19 NOTE — Progress Notes (Signed)
Subjective: 39 y.o. returns the office today for concerns of thick, painful elongated toenails that he cannot trim himself.  He has no new concerns.  States he gets some swelling discomfort after being on his feet for the sixth day of work but otherwise he has been doing well.  No changes otherwise. Denies any acute changes since last appointment and no new complaints today. Denies any systemic complaints such as fevers, chills, nausea, vomiting.   PCP: Elvis Coil, MD  Objective: AAO 3, NAD DP/PT pulses palpable, CRT less than 3 seconds Protective sensation decreased with Simms Weinstein monofilament, Achilles tendon reflex intact.  Nails hypertrophic, dystrophic, elongated, brittle, discolored 10. There is tenderness overlying the nails 1-5 bilaterally. There is no surrounding erythema or drainage along the nail sites. Chronic mild swelling to bilateral ankles however symmetrical today and there is no erythema or warmth there is no tenderness. No open lesions or pre-ulcerative lesions are identified. No other areas of tenderness bilateral lower extremities. No overlying edema, erythema, increased warmth. No pain with calf compression, swelling, warmth, erythema.  Assessment: Patient presents with symptomatic onychomycosis; Charcot  Plan: -Treatment options including alternatives, risks, complications were discussed -Nails sharply debrided x 10 without complication/bleeding. -Charcot appears to be stable.  Continue with supportive shoes and his inserts.  Compression socks. -Discussed daily foot inspection. If there are any changes, to call the office immediately.  -Follow-up in 3 months or sooner if any problems are to arise. In the meantime, encouraged to call the office with any questions, concerns, changes symptoms.  Ovid Curd, DPM

## 2019-09-18 ENCOUNTER — Ambulatory Visit (INDEPENDENT_AMBULATORY_CARE_PROVIDER_SITE_OTHER): Payer: BC Managed Care – PPO

## 2019-09-18 ENCOUNTER — Ambulatory Visit: Payer: BC Managed Care – PPO | Admitting: Podiatry

## 2019-09-18 ENCOUNTER — Other Ambulatory Visit: Payer: Self-pay

## 2019-09-18 DIAGNOSIS — M79671 Pain in right foot: Secondary | ICD-10-CM | POA: Diagnosis not present

## 2019-09-18 DIAGNOSIS — G8929 Other chronic pain: Secondary | ICD-10-CM

## 2019-09-18 DIAGNOSIS — M79672 Pain in left foot: Secondary | ICD-10-CM

## 2019-09-18 DIAGNOSIS — M7751 Other enthesopathy of right foot: Secondary | ICD-10-CM

## 2019-09-18 DIAGNOSIS — E1149 Type 2 diabetes mellitus with other diabetic neurological complication: Secondary | ICD-10-CM

## 2019-09-18 DIAGNOSIS — B351 Tinea unguium: Secondary | ICD-10-CM

## 2019-09-18 DIAGNOSIS — M79676 Pain in unspecified toe(s): Secondary | ICD-10-CM | POA: Diagnosis not present

## 2019-09-18 DIAGNOSIS — M14672 Charcot's joint, left ankle and foot: Secondary | ICD-10-CM | POA: Diagnosis not present

## 2019-09-18 DIAGNOSIS — M14671 Charcot's joint, right ankle and foot: Secondary | ICD-10-CM

## 2019-09-18 DIAGNOSIS — M79673 Pain in unspecified foot: Secondary | ICD-10-CM

## 2019-09-18 DIAGNOSIS — M779 Enthesopathy, unspecified: Secondary | ICD-10-CM

## 2019-09-18 DIAGNOSIS — M7752 Other enthesopathy of left foot: Secondary | ICD-10-CM | POA: Diagnosis not present

## 2019-09-18 NOTE — Progress Notes (Signed)
Subjective: 39 y.o. returns the office today for concerns of thick, painful elongated toenails that he cannot trim himself.  He also states he been having pain to his feet has had to take time off of work.  They have been swelling intermittently.  No redness or warmth associated with the swelling.  No recent injury.  He states that work on the concrete floors aggravate his feet.  His orthotics now are couple of years old. Denies any systemic complaints such as fevers, chills, nausea, vomiting.   PCP: Elvis Coil, MD  Objective: AAO 3, NAD DP/PT pulses palpable, CRT less than 3 seconds Protective sensation decreased with Simms Weinstein monofilament, Achilles tendon reflex intact.  Nails hypertrophic, dystrophic, elongated, brittle, discolored 10. There is tenderness overlying the nails 1-5 bilaterally. There is no surrounding erythema or drainage along the nail sites. Chronic mild swelling to bilateral feet however symmetrical today and there is no erythema or warmth there is no tenderness.  There is diffuse tenderness although mild to the midfoot area.  There is no specific area of tenderness.  Flexor, extensor tendons appear to be intact. No open lesions or pre-ulcerative lesions are identified. No other areas of tenderness bilateral lower extremities. No overlying edema, erythema, increased warmth. No pain with calf compression, swelling, warmth, erythema.  Assessment: Patient presents with symptomatic onychomycosis; Charcot  Plan: -Treatment options including alternatives, risks, complications were discussed -Nails sharply debrided x 10 without complication/bleeding. -X-rays were obtained and reviewed.  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  Does not appear to be significantly changed compared to prior x-rays.  We discussed shoe modifications, new orthotics or bracing.  Discussed with Raiford Noble our pedorthotist is a measure for new orthotics.   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  Hard on his feet.  Previously discussed changing jobs and having a more sitting position.  Ovid Curd, DPM  --- Ria Clock- F/O note:  Cast, deep heel cup, accommodative type, 81mm spenco t/c ovre 64mm poron cushion. Dr. Dannial Monarch to drop charges.

## 2019-10-09 ENCOUNTER — Other Ambulatory Visit: Payer: BC Managed Care – PPO | Admitting: Orthotics

## 2019-10-11 ENCOUNTER — Other Ambulatory Visit: Payer: Self-pay | Admitting: Podiatry

## 2019-10-11 DIAGNOSIS — M14679 Charcot's joint, unspecified ankle and foot: Secondary | ICD-10-CM

## 2019-10-30 ENCOUNTER — Ambulatory Visit: Payer: BC Managed Care – PPO | Admitting: Podiatry

## 2019-10-30 ENCOUNTER — Other Ambulatory Visit: Payer: Self-pay

## 2020-02-29 ENCOUNTER — Other Ambulatory Visit: Payer: Self-pay

## 2020-02-29 ENCOUNTER — Ambulatory Visit: Payer: BC Managed Care – PPO | Admitting: Podiatry

## 2020-02-29 DIAGNOSIS — E1149 Type 2 diabetes mellitus with other diabetic neurological complication: Secondary | ICD-10-CM | POA: Diagnosis not present

## 2020-02-29 DIAGNOSIS — B351 Tinea unguium: Secondary | ICD-10-CM

## 2020-02-29 DIAGNOSIS — M79676 Pain in unspecified toe(s): Secondary | ICD-10-CM | POA: Diagnosis not present

## 2020-02-29 NOTE — Progress Notes (Signed)
Subjective: 39 y.o. returns the office today for concerns of thick, painful elongated toenails that he cannot trim himself.  He denies any open sores.  He states his pain has been controlled he had no issues with his feet otherwise since I last saw him.  No significant increase in swelling no redness or warmth.  No falls.   PCP: Elvis Coil, MD  Objective: AAO 3, NAD DP/PT pulses palpable, CRT less than 3 seconds Protective sensation decreased with Simms Weinstein monofilament, Achilles tendon reflex intact.  Nails hypertrophic, dystrophic, elongated, brittle, discolored 10. There is tenderness overlying the nails 1-5 bilaterally. There is no surrounding erythema or drainage along the nail sites. Mild chronic edema present bilaterally there is no area tenderness.  The feet appear to be stable.  No increase in warmth equal temperature gradient, color bilaterally. No open lesions or pre-ulcerative lesions are identified. No other areas of tenderness bilateral lower extremities. No overlying edema, erythema, increased warmth. No pain with calf compression, swelling, warmth, erythema.  Assessment: Patient presents with symptomatic onychomycosis; Charcot  Plan: -Treatment options including alternatives, risks, complications were discussed -Nails sharply debrided x 10 without complication/bleeding. -Discussed with inspection. -Continue with supportive shoes, inserts.  Vivi Barrack DPM

## 2020-06-06 ENCOUNTER — Encounter: Payer: Self-pay | Admitting: Podiatry

## 2020-06-06 ENCOUNTER — Other Ambulatory Visit: Payer: Self-pay

## 2020-06-06 ENCOUNTER — Ambulatory Visit (INDEPENDENT_AMBULATORY_CARE_PROVIDER_SITE_OTHER): Payer: BC Managed Care – PPO | Admitting: Podiatry

## 2020-06-06 DIAGNOSIS — M79676 Pain in unspecified toe(s): Secondary | ICD-10-CM

## 2020-06-06 DIAGNOSIS — B351 Tinea unguium: Secondary | ICD-10-CM

## 2020-06-06 DIAGNOSIS — E1149 Type 2 diabetes mellitus with other diabetic neurological complication: Secondary | ICD-10-CM

## 2020-06-09 NOTE — Progress Notes (Signed)
Subjective: 40 y.o. returns the office today for concerns of thick, painful elongated toenails that he cannot trim himself.  He denies any open sores.  He has also changed shoes and feels that the swelling to his foot is been much better.  He is not expensing any other pain to his feet.  No significant increase in swelling no redness or warmth.  No falls.   PCP: Elvis Coil, MD  Objective: AAO 3, NAD DP/PT pulses palpable, CRT less than 3 seconds Protective sensation decreased with Simms Weinstein monofilament, Achilles tendon reflex intact.  Nails hypertrophic, dystrophic, elongated, brittle, discolored 10. There is tenderness overlying the nails 1-5 bilaterally. There is no surrounding erythema or drainage along the nail sites. Minimal edema.  No erythema or warmth.  No temperature difference bilaterally. No other areas of tenderness bilateral lower extremities. No overlying edema, erythema, increased warmth. No pain with calf compression, swelling, warmth, erythema.  Assessment: Patient presents with symptomatic onychomycosis; Charcot  Plan: -Treatment options including alternatives, risks, complications were discussed -Nails sharply debrided x 10 without complication/bleeding. -Discussed with inspection. -Continue with supportive shoes, inserts.  Vivi Barrack DPM

## 2020-09-05 ENCOUNTER — Ambulatory Visit: Payer: BC Managed Care – PPO | Admitting: Podiatry

## 2020-09-19 ENCOUNTER — Other Ambulatory Visit: Payer: Self-pay

## 2020-09-19 ENCOUNTER — Ambulatory Visit (INDEPENDENT_AMBULATORY_CARE_PROVIDER_SITE_OTHER): Payer: BC Managed Care – PPO | Admitting: Podiatry

## 2020-09-19 DIAGNOSIS — E1149 Type 2 diabetes mellitus with other diabetic neurological complication: Secondary | ICD-10-CM | POA: Diagnosis not present

## 2020-09-19 DIAGNOSIS — M14672 Charcot's joint, left ankle and foot: Secondary | ICD-10-CM

## 2020-09-19 DIAGNOSIS — B351 Tinea unguium: Secondary | ICD-10-CM

## 2020-09-19 DIAGNOSIS — M14671 Charcot's joint, right ankle and foot: Secondary | ICD-10-CM

## 2020-09-19 DIAGNOSIS — M79676 Pain in unspecified toe(s): Secondary | ICD-10-CM | POA: Diagnosis not present

## 2020-09-20 NOTE — Progress Notes (Signed)
Subjective: 40 y.o. returns the office today for concerns of thick, painful elongated toenails that he cannot trim himself.  He denies any open sores.  He states he still has intermittent swelling depending on which he is on his feet but has not been any thing out of the ordinary.  Denies any warmth to his feet.  No significant pain he reports.  No other concerns.  PCP: Elvis Coil, MD  Objective: AAO 3, NAD DP/PT pulses palpable, CRT less than 3 seconds Protective sensation decreased with Simms Weinstein monofilament Nails hypertrophic, dystrophic, elongated, brittle, discolored 10. There is tenderness overlying the nails 1-5 bilaterally. There is no surrounding erythema or drainage along the nail sites. Minimal edema.  No erythema or warmth.  No temperature difference bilaterally. No other areas of tenderness bilateral lower extremities. No overlying edema, erythema, increased warmth. No pain with calf compression, swelling, warmth, erythema.  Assessment: Patient presents with symptomatic onychomycosis; Charcot  Plan: -Treatment options including alternatives, risks, complications were discussed -Nails sharply debrided x 10 without complication/bleeding. -Charcot appears to be stable.  Continue to monitor this and proper shoe gear. -Discussed with inspection. -Continue with supportive shoes, inserts.  Vivi Barrack DPM

## 2020-12-23 ENCOUNTER — Other Ambulatory Visit: Payer: Self-pay

## 2020-12-23 ENCOUNTER — Encounter: Payer: Self-pay | Admitting: Podiatry

## 2020-12-23 ENCOUNTER — Ambulatory Visit: Payer: BC Managed Care – PPO | Admitting: Podiatry

## 2020-12-23 DIAGNOSIS — B351 Tinea unguium: Secondary | ICD-10-CM | POA: Diagnosis not present

## 2020-12-23 DIAGNOSIS — M79676 Pain in unspecified toe(s): Secondary | ICD-10-CM | POA: Diagnosis not present

## 2020-12-23 DIAGNOSIS — E1149 Type 2 diabetes mellitus with other diabetic neurological complication: Secondary | ICD-10-CM | POA: Diagnosis not present

## 2020-12-24 NOTE — Progress Notes (Signed)
Subjective: 40 y.o. returns the office today for concerns of thick, painful elongated toenails that he cannot trim himself.  He denies any open sores.  He states he still has intermittent swelling depending on which he is on his feet. He has been out of work for the last few days so the swelling is not bad. No significant pain he reports.  No other concerns.  PCP: Elvis Coil, MD  Objective: AAO 3, NAD DP/PT pulses palpable, CRT less than 3 seconds Protective sensation decreased with Simms Weinstein monofilament Nails hypertrophic, dystrophic, elongated, brittle, discolored 10. There is tenderness overlying the nails 1-5 bilaterally. There is no surrounding erythema or drainage along the nail sites. Minimal edema.  No erythema or warmth.  No temperature difference bilaterally and with normal proximal to distal cooling.  No other areas of tenderness bilateral lower extremities. No overlying edema, erythema, increased warmth. No pain with calf compression, swelling, warmth, erythema.  Assessment: Patient presents with symptomatic onychomycosis; Charcot  Plan: -Treatment options including alternatives, risks, complications were discussed -Nails sharply debrided x 10 without complication/bleeding. -Charcot appears to be stable.  Continue to monitor this and proper shoe gear. -Discussed with inspection. -Continue with supportive shoes, inserts.  Vivi Barrack DPM

## 2021-03-25 ENCOUNTER — Ambulatory Visit: Payer: BC Managed Care – PPO | Admitting: Podiatry

## 2021-07-30 ENCOUNTER — Ambulatory Visit: Payer: BC Managed Care – PPO | Admitting: Podiatry

## 2021-08-04 ENCOUNTER — Ambulatory Visit: Payer: BC Managed Care – PPO | Admitting: Podiatry

## 2021-08-04 ENCOUNTER — Encounter: Payer: Self-pay | Admitting: Podiatry

## 2021-08-04 DIAGNOSIS — M79674 Pain in right toe(s): Secondary | ICD-10-CM

## 2021-08-04 DIAGNOSIS — B351 Tinea unguium: Secondary | ICD-10-CM | POA: Diagnosis not present

## 2021-08-04 DIAGNOSIS — N179 Acute kidney failure, unspecified: Secondary | ICD-10-CM

## 2021-08-04 DIAGNOSIS — E11610 Type 2 diabetes mellitus with diabetic neuropathic arthropathy: Secondary | ICD-10-CM | POA: Diagnosis not present

## 2021-08-04 DIAGNOSIS — M14672 Charcot's joint, left ankle and foot: Secondary | ICD-10-CM

## 2021-08-04 DIAGNOSIS — M79675 Pain in left toe(s): Secondary | ICD-10-CM

## 2021-08-04 DIAGNOSIS — G629 Polyneuropathy, unspecified: Secondary | ICD-10-CM

## 2021-08-04 NOTE — Progress Notes (Signed)
This patient returns to my office for at risk foot care.  This patient requires this care by a professional since this patient will be at risk due to having diabetes, kidney transplant history and neuropathy. ?This patient is unable to cut nails himself since the patient cannot reach his nails.These nails are painful walking and wearing shoes.  This patient presents for at risk foot care today. ? ?General Appearance  Alert, conversant and in no acute stress. ? ?Vascular  Dorsalis pedis and posterior tibial  pulses are  weakly palpable  bilaterally.  Capillary return is within normal limits  bilaterally. Temperature is within normal limits  bilaterally. ? ?Neurologic  Senn-Weinstein monofilament wire test within normal limits  bilaterally. Muscle power within normal limits bilaterally. ? ?Nails Thick disfigured discolored nails with subungual debris  from hallux to fifth toes bilaterally. No evidence of bacterial infection or drainage bilaterally. ? ?Orthopedic  No limitations of motion  feet .  No crepitus or effusions noted.  No bony pathology or digital deformities noted. ? ?Skin  normotropic skin with no porokeratosis noted bilaterally.  No signs of infections or ulcers noted.    ? ?Onychomycosis  Pain in right toes  Pain in left toes ? ?Consent was obtained for treatment procedures.   Mechanical debridement of nails 1-5  bilaterally performed with a nail nipper.  Filed with dremel without incident.  ? ? ?Return office visit    4 months                   Told patient to return for periodic foot care and evaluation due to potential at risk complications. ? ? ?Helane Gunther DPM   ?

## 2021-12-05 ENCOUNTER — Ambulatory Visit: Payer: BC Managed Care – PPO | Admitting: Podiatry

## 2021-12-05 DIAGNOSIS — E1149 Type 2 diabetes mellitus with other diabetic neurological complication: Secondary | ICD-10-CM | POA: Diagnosis not present

## 2021-12-05 DIAGNOSIS — M79674 Pain in right toe(s): Secondary | ICD-10-CM

## 2021-12-05 DIAGNOSIS — M79675 Pain in left toe(s): Secondary | ICD-10-CM

## 2021-12-05 DIAGNOSIS — B351 Tinea unguium: Secondary | ICD-10-CM | POA: Diagnosis not present

## 2021-12-08 NOTE — Progress Notes (Signed)
Subjective: 40 y.o. returns the office today for concerns of thick, painful elongated toenails that he cannot trim himself.   No significant increase in swelling.  No increased temperature that he notes to his feet.  No open lesions.  He has no pain otherwise.   PCP: Elvis Coil, MD  Objective: AAO 3, NAD DP/PT pulses palpable, CRT less than 3 seconds Protective sensation decreased with Simms Weinstein monofilament Nails hypertrophic, dystrophic, elongated, brittle, discolored 10. There is tenderness overlying the nails 1-5 bilaterally. There is no surrounding erythema or drainage along the nail sites. Mild chronic edema present there is no erythema or warmth. No other areas of tenderness bilateral lower extremities. No overlying edema, erythema, increased warmth. No pain with calf compression, swelling, warmth, erythema.  Assessment: Patient presents with symptomatic onychomycosis; Charcot  Plan: -Treatment options including alternatives, risks, complications were discussed -Nails sharply debrided x 10 without complication/bleeding. -Charcot appears to be stable.  Continue to monitor this and proper shoe gear. -Discussed with inspection. -Continue with supportive shoes, inserts.  Vivi Barrack DPM

## 2022-03-10 ENCOUNTER — Ambulatory Visit: Payer: BC Managed Care – PPO | Admitting: Podiatry

## 2022-11-19 ENCOUNTER — Ambulatory Visit: Payer: BC Managed Care – PPO | Admitting: Podiatry

## 2022-11-19 DIAGNOSIS — E1149 Type 2 diabetes mellitus with other diabetic neurological complication: Secondary | ICD-10-CM

## 2022-11-19 DIAGNOSIS — M79675 Pain in left toe(s): Secondary | ICD-10-CM

## 2022-11-19 DIAGNOSIS — M79674 Pain in right toe(s): Secondary | ICD-10-CM

## 2022-11-19 DIAGNOSIS — B351 Tinea unguium: Secondary | ICD-10-CM

## 2022-11-19 NOTE — Patient Instructions (Signed)

## 2022-11-19 NOTE — Progress Notes (Signed)
Subjective: Chief Complaint  Patient presents with   Diabetes    Patient came in today for Diabetic foot care, nail trim,     42 y.o. returns the office today for concerns of thick, painful elongated toenails that he cannot trim himself.   No significant increase in swelling. No open lesions or new concerns.   PCP: Elvis Coil, MD  Objective: AAO 3, NAD DP/PT pulses palpable, CRT less than 3 seconds Protective sensation decreased with Simms Weinstein monofilament Nails hypertrophic, dystrophic, elongated, brittle, discolored 10. There is tenderness overlying the nails 1-5 bilaterally. There is no surrounding erythema or drainage along the nail sites. Mild chronic edema present there is no erythema or warmth. Hammertoes present. No pain with calf compression, swelling, warmth, erythema.  Assessment: Patient presents with symptomatic onychomycosis; Charcot  Plan: -Treatment options including alternatives, risks, complications were discussed -Nails sharply debrided x 10 without complication/bleeding. -Toe crest for hammertoes to help offload.  -Charcot appears to be stable.  Continue to monitor this and proper shoe gear. -Discussed with inspection. -Continue with supportive shoes, inserts.  Vivi Barrack DPM

## 2023-02-19 ENCOUNTER — Ambulatory Visit: Payer: BC Managed Care – PPO | Admitting: Podiatry

## 2023-06-01 ENCOUNTER — Ambulatory Visit: Payer: BC Managed Care – PPO | Admitting: Podiatry

## 2024-04-21 ENCOUNTER — Ambulatory Visit: Admitting: Podiatry
